# Patient Record
Sex: Female | Born: 1970
Health system: Southern US, Community
[De-identification: ages and names within clinical notes are randomized; demographics above are authoritative.]

## PROBLEM LIST (undated history)

## (undated) DIAGNOSIS — F419 Anxiety disorder, unspecified: Secondary | ICD-10-CM

## (undated) DIAGNOSIS — E538 Deficiency of other specified B group vitamins: Secondary | ICD-10-CM

## (undated) DIAGNOSIS — E559 Vitamin D deficiency, unspecified: Secondary | ICD-10-CM

## (undated) DIAGNOSIS — F32A Depression, unspecified: Secondary | ICD-10-CM

## (undated) DIAGNOSIS — R197 Diarrhea, unspecified: Secondary | ICD-10-CM

## (undated) HISTORY — DX: Depression, unspecified: F32.A

## (undated) HISTORY — DX: Vitamin D deficiency, unspecified: E55.9

## (undated) HISTORY — DX: Diarrhea, unspecified: R19.7

## (undated) HISTORY — DX: Deficiency of other specified B group vitamins: E53.8

## (undated) HISTORY — PX: WISDOM TOOTH EXTRACTION: SHX21

## (undated) HISTORY — DX: Anxiety disorder, unspecified: F41.9

---

## 2013-10-12 ENCOUNTER — Ambulatory Visit (INDEPENDENT_AMBULATORY_CARE_PROVIDER_SITE_OTHER): Payer: BC Managed Care – PPO | Admitting: Nurse Practitioner

## 2013-10-12 ENCOUNTER — Encounter: Payer: Self-pay | Admitting: Nurse Practitioner

## 2013-10-12 VITALS — BP 124/72 | HR 76 | Resp 16 | Ht 60.5 in | Wt 112.0 lb

## 2013-10-12 DIAGNOSIS — Z0000 Encounter for general adult medical examination without abnormal findings: Secondary | ICD-10-CM

## 2013-10-12 DIAGNOSIS — Z01419 Encounter for gynecological examination (general) (routine) without abnormal findings: Secondary | ICD-10-CM

## 2013-10-12 LAB — POCT URINALYSIS DIPSTICK
Bilirubin, UA: NEGATIVE
Blood, UA: NEGATIVE
GLUCOSE UA: NEGATIVE
Ketones, UA: NEGATIVE
Leukocytes, UA: NEGATIVE
NITRITE UA: NEGATIVE
PROTEIN UA: NEGATIVE
Urobilinogen, UA: NEGATIVE
pH, UA: 6

## 2013-10-12 LAB — HEMOGLOBIN, FINGERSTICK: HEMOGLOBIN, FINGERSTICK: 14.4 g/dL (ref 12.0–16.0)

## 2013-10-12 MED ORDER — NORETHIN-ETH ESTRAD-FE BIPHAS 1 MG-10 MCG / 10 MCG PO TABS: 1.0000 | ORAL_TABLET | Freq: Every day | ORAL | Status: DC

## 2013-10-12 NOTE — Patient Instructions (Signed)

## 2013-10-12 NOTE — Progress Notes (Signed)
Patient ID: Katie Petersen, female   DOB: [birth year], 43 y.o.   MRN: [id number] 43 y.o.  Divorced Caucasian Fe here for annual exam. Just moved from [place] to work here.  Moved here alone and has no family nearby.  Not dating or SA since her Divorce 10 years ago.  Normal menses was very heavy and severe dysmenorrhea every 11 days.  Then started OCP Lo Loestrin in 2012, now with amenorrhea.  Initially had some spotting.  Now no spotting for 8 months.  Patient's last menstrual period was 08/31/2010.          Sexually active: No.  The current method of family planning is OCP (estrogen/progesterone).    Exercising: Yes.    Gym/ health club routine includes yoga, swim and cardioboxing class.  Yoga once a week, swimming twice weekly and cardio boxing once weekly. Smoker:  no  Health Maintenance: Pap:  10/2012, negative with neg HR HPV per patient MMG:  12/2012, normal, but showed dense breast per patient TDaP:  ?, past due Labs: HB:  14.4  Urine:  negative   reports that she has never smoked. She has never used smokeless tobacco. She reports that she drinks alcohol. She reports that she does not use illicit drugs.  History reviewed. No pertinent past medical history.  Past Surgical History  Procedure Laterality Date  . Wisdom tooth extraction      Current Outpatient Prescriptions  Medication Sig Dispense Refill  . Norethindrone-Ethinyl Estradiol-Fe Biphas (LO LOESTRIN FE) 1 MG-10 MCG / 10 MCG tablet Take 1 tablet by mouth daily.  3 Package  3   No current facility-administered medications for this visit.    Family History  Problem Relation Age of Onset  . Hyperlipidemia Mother   . Hypertension Mother   . Thyroid disease Mother   . Breast cancer Paternal Aunt 30  . Breast cancer Maternal Grandmother 80  . Cancer Maternal Grandfather     lung, smoker and coal miner    ROS:  Pertinent items are noted in HPI.  Otherwise, a comprehensive ROS was negative.  Exam:   BP 124/72  Pulse  76  Resp 16  Ht 5' 0.5" (1.537 m)  Wt 112 lb (50.803 kg)  BMI 21.51 kg/m2  LMP 08/31/2010 Height: 5' 0.5" (153.7 cm)  Ht Readings from Last 3 Encounters:  10/12/13 5' 0.5" (1.537 m)    General appearance: alert, cooperative and appears stated age Head: Normocephalic, without obvious abnormality, atraumatic Neck: no adenopathy, supple, symmetrical, trachea midline and thyroid normal to inspection and palpation Lungs: clear to auscultation bilaterally Breasts: normal appearance, no masses or tenderness Heart: regular rate and rhythm Abdomen: soft, non-tender; no masses,  no organomegaly Extremities: extremities normal, atraumatic, no cyanosis or edema Skin: Skin color, texture, turgor normal. No rashes or lesions Lymph nodes: Cervical, supraclavicular, and axillary nodes normal. No abnormal inguinal nodes palpated Neurologic: Grossly normal   Pelvic: External genitalia:  no lesions              Urethra:  normal appearing urethra with no masses, tenderness or lesions              Bartholin's and Skene's: normal                 Vagina: normal appearing vagina with normal color and discharge, no lesions              Cervix: anteverted  Pap taken: No. Bimanual Exam:  Uterus:  normal size, contour, position, consistency, mobility, non-tender              Adnexa: no mass, fullness, tenderness               Rectovaginal: Confirms               Anus:  normal sphincter tone, no lesions  A:  Well Woman with normal exam  OCP for menorrhagia and irregular menses  Not SA  P:   Reviewed health and wellness pertinent to exam  Pap smear not taken today  Mammogram is due 10/15  Refill OCP Lo Loestrin for a year  Counseled on breast self exam, mammography screening, use and side effects of OCP's, adequate intake of calcium and vitamin D, diet and exercise, Kegel's exercises return annually or prn  An After Visit Summary was printed and given to the patient.  ROI of past GYN  records: if no pap or abnormal pap she is aware may need to return.  Otherwise  recheck in 1 year.

## 2013-10-14 NOTE — Progress Notes (Signed)
Note reviewed, agree with plan.  Marchel Foote, MD  

## 2014-08-25 ENCOUNTER — Other Ambulatory Visit: Payer: Self-pay | Admitting: Nurse Practitioner

## 2014-08-25 DIAGNOSIS — Z62810 Personal history of physical and sexual abuse in childhood: Secondary | ICD-10-CM | POA: Insufficient documentation

## 2014-08-25 NOTE — Telephone Encounter (Signed)
Medication refill request: Lo Loestrin  Last AEX:  10/12/13 PG Next AEX: 10/25/14 PG Last MMG (if hormonal medication request): none Refill authorized: 10/12/13 #3packs w/3 R. Today #3pack w/0R?

## 2014-10-25 ENCOUNTER — Encounter: Payer: Self-pay | Admitting: Nurse Practitioner

## 2014-10-25 ENCOUNTER — Ambulatory Visit (INDEPENDENT_AMBULATORY_CARE_PROVIDER_SITE_OTHER): Payer: BLUE CROSS/BLUE SHIELD | Admitting: Nurse Practitioner

## 2014-10-25 VITALS — BP 110/68 | HR 68 | Ht 60.75 in | Wt 123.0 lb

## 2014-10-25 DIAGNOSIS — Z0000 Encounter for general adult medical examination without abnormal findings: Secondary | ICD-10-CM

## 2014-10-25 DIAGNOSIS — Z01419 Encounter for gynecological examination (general) (routine) without abnormal findings: Secondary | ICD-10-CM

## 2014-10-25 LAB — POCT URINALYSIS DIPSTICK
Bilirubin, UA: NEGATIVE
Glucose, UA: NEGATIVE
Ketones, UA: NEGATIVE
Leukocytes, UA: NEGATIVE
NITRITE UA: NEGATIVE
PROTEIN UA: NEGATIVE
RBC UA: NEGATIVE
Urobilinogen, UA: NEGATIVE
pH, UA: 6

## 2014-10-25 MED ORDER — NORETHIN-ETH ESTRAD-FE BIPHAS 1 MG-10 MCG / 10 MCG PO TABS: 1.0000 | ORAL_TABLET | Freq: Every day | ORAL | Status: DC

## 2014-10-25 NOTE — Progress Notes (Signed)
Patient ID: Katie Petersen, female   DOB: January 01, 1971, 44 y.o.   MRN: [id number] 44 y.o. G0P0 Divorced  Caucasian Fe here for annual exam.  Menses on Lo Loestrin is absent.  initially got some spotting now none. Dating but not SA.  She is having an increase in eczema and skin allergies.  She saw someone at [facility] in [place] and had extensive testing for food allergies done.  She is working on cutting out gluten, dairy and eggs.   Patient's last menstrual period was 08/31/2010.          Sexually active: No. not SA for 10 years since divorce The current method of family planning is OCP (estrogen/progesterone).    Exercising: Yes.    walking and yoga 2-3 times per week Smoker:  no  Health Maintenance: Pap: 9/17/214, negative with neg HR HPV per patient MMG: 01/13/13, normal, but showed dense breast per patient TDaP: ?, past due Labs: HB: PCP, Dr. Mardelle Matte ([facility])Urine:  negative    reports that she has never smoked. She has never used smokeless tobacco. She reports that she drinks alcohol. She reports that she does not use illicit drugs.  History reviewed. No pertinent past medical history.  Past Surgical History  Procedure Laterality Date  . Wisdom tooth extraction      Current Outpatient Prescriptions  Medication Sig Dispense Refill  . Cholecalciferol (VITAMIN D3) 2000 UNITS TABS Take 1 tablet by mouth daily.    . clonazePAM (KLONOPIN) 1 MG tablet Take 1 tablet by mouth 2 (two) times daily as needed.    Marland Kitchen escitalopram (LEXAPRO) 10 MG tablet Take 1 tablet by mouth daily.    . magnesium oxide (MAG-OX) 400 MG tablet Take 800 mg by mouth daily.    . Norethindrone-Ethinyl Estradiol-Fe Biphas (LO LOESTRIN FE) 1 MG-10 MCG / 10 MCG tablet Take 1 tablet by mouth daily. 84 tablet 3  . vitamin B-12 (CYANOCOBALAMIN) 1000 MCG tablet Take 1,000 mcg by mouth daily.     No current facility-administered medications for this visit.    Family History  Problem Relation  Age of Onset  . Hyperlipidemia Mother   . Hypertension Mother   . Thyroid disease Mother   . Breast cancer Paternal Aunt 79  . Breast cancer Maternal Grandmother 80  . Cancer Maternal Grandfather     lung, smoker and coal miner    ROS:  Pertinent items are noted in HPI.  Otherwise, a comprehensive ROS was negative.  Exam:   BP 110/68 mmHg  Pulse 68  Ht 5' 0.75" (1.543 m)  Wt 123 lb (55.792 kg)  BMI 23.43 kg/m2  LMP 08/31/2010 Height: 5' 0.75" (154.3 cm) Ht Readings from Last 3 Encounters:  10/25/14 5' 0.75" (1.543 m)  10/12/13 5' 0.5" (1.537 m)    General appearance: alert, cooperative and appears stated age Head: Normocephalic, without obvious abnormality, atraumatic Neck: no adenopathy, supple, symmetrical, trachea midline and thyroid normal to inspection and palpation Lungs: clear to auscultation bilaterally Breasts: normal appearance, no masses or tenderness Heart: regular rate and rhythm Abdomen: soft, non-tender; no masses,  no organomegaly Extremities: extremities normal, atraumatic, no cyanosis or edema Skin: Skin color, texture, turgor normal. No rashes or lesions Lymph nodes: Cervical, supraclavicular, and axillary nodes normal. No abnormal inguinal nodes palpated Neurologic: Grossly normal   Pelvic: External genitalia:  no lesions              Urethra:  normal appearing urethra with no masses, tenderness or lesions  Bartholin's and Skene's: normal                 Vagina: normal appearing vagina with normal color and discharge, no lesions              Cervix: anteverted              Pap taken: Yes.   Bimanual Exam:  Uterus:  normal size, contour, position, consistency, mobility, non-tender              Adnexa: no mass, fullness, tenderness               Rectovaginal: Confirms               Anus:  normal sphincter tone, no lesions  Chaperone present:  yes  A:  Well Woman with normal exam  OCP for menorrhagia and irregular menses  Amenorrhea on  OCP Not SA  P:   Reviewed health and wellness pertinent to exam  Pap smear as above  Mammogram is due this fall and will schedule  Refill on Lo Loestrin for a year  Name and phone number given for [facility] in [place] as this may be closer for her  Counseled on breast self exam, mammography screening, adequate intake of calcium and vitamin D, diet and exercise return annually or prn  An After Visit Summary was printed and given to the patient.

## 2014-10-25 NOTE — Patient Instructions (Signed)

## 2014-10-27 LAB — IPS PAP TEST WITH HPV

## 2014-10-29 NOTE — Progress Notes (Signed)
Encounter reviewed by Dr. Brook Amundson C. Silva.  

## 2014-12-28 DIAGNOSIS — F410 Panic disorder [episodic paroxysmal anxiety] without agoraphobia: Secondary | ICD-10-CM | POA: Insufficient documentation

## 2014-12-28 DIAGNOSIS — Z3041 Encounter for surveillance of contraceptive pills: Secondary | ICD-10-CM | POA: Insufficient documentation

## 2014-12-28 HISTORY — DX: Encounter for surveillance of contraceptive pills: Z30.41

## 2015-02-21 ENCOUNTER — Telehealth: Payer: Self-pay | Admitting: Nurse Practitioner

## 2015-02-21 NOTE — Telephone Encounter (Signed)
Ok for change of pharmacy and med's can be refilled to her pharmacy of choice.  May close the encounter.

## 2015-02-21 NOTE — Telephone Encounter (Signed)
Patient called to notify us that she will be switching to a mail order pharmacy called [facility] which is apart of [facility] due to her having new insurance. She does not need any refills right now but will need to get her refills from them going forward.  Routed to Ms. Patty FYI.

## 2015-02-22 ENCOUNTER — Telehealth: Payer: Self-pay | Admitting: Nurse Practitioner

## 2015-02-22 MED ORDER — NORETHIN-ETH ESTRAD-FE BIPHAS 1 MG-10 MCG / 10 MCG PO TABS: 1.0000 | ORAL_TABLET | Freq: Every day | ORAL | Status: DC

## 2015-02-22 NOTE — Telephone Encounter (Signed)
OK to change to mail order

## 2015-02-22 NOTE — Telephone Encounter (Signed)
Patient is requesting a new 4990 DAY SUPPLY prescription for LO LO ESTRIN to be sent to [facility] mail order pharmacy. [phone] AND FAX [phone]

## 2015-02-22 NOTE — Telephone Encounter (Signed)
Order sent to mail order

## 2015-02-22 NOTE — Telephone Encounter (Signed)
Medication refill request: Patient is requesting Pharmacy change to [facility] for her Lo LoEstrin Rx. Last AEX:  10/25/2014 PG Next AEX: 11/07/2015 PG Last MMG (if hormonal medication request): 01/13/2013 Normal / Negative Refill authorized: Lo Loestrin FE #84 tabs 3 Refills 10/25/2014 Today: Lo Loestrin FE #84 tabs 3 Refills ?

## 2015-02-27 NOTE — Telephone Encounter (Signed)
Left detailed message for patient okay per designated party release form.  Advised to call back with any questions.

## 2015-11-07 ENCOUNTER — Ambulatory Visit: Payer: BLUE CROSS/BLUE SHIELD | Admitting: Nurse Practitioner

## 2015-11-15 ENCOUNTER — Encounter: Payer: Self-pay | Admitting: Nurse Practitioner

## 2015-11-15 ENCOUNTER — Ambulatory Visit (INDEPENDENT_AMBULATORY_CARE_PROVIDER_SITE_OTHER): Payer: PRIVATE HEALTH INSURANCE | Admitting: Nurse Practitioner

## 2015-11-15 VITALS — BP 120/74 | HR 64 | Ht 60.25 in | Wt 132.0 lb

## 2015-11-15 DIAGNOSIS — R8290 Unspecified abnormal findings in urine: Secondary | ICD-10-CM | POA: Diagnosis not present

## 2015-11-15 DIAGNOSIS — Z0000 Encounter for general adult medical examination without abnormal findings: Secondary | ICD-10-CM | POA: Diagnosis not present

## 2015-11-15 DIAGNOSIS — Z01419 Encounter for gynecological examination (general) (routine) without abnormal findings: Secondary | ICD-10-CM | POA: Diagnosis not present

## 2015-11-15 LAB — POCT URINALYSIS DIPSTICK
BILIRUBIN UA: NEGATIVE
Blood, UA: NEGATIVE
Glucose, UA: NEGATIVE
Ketones, UA: NEGATIVE
Nitrite, UA: NEGATIVE
PROTEIN UA: NEGATIVE
Urobilinogen, UA: NEGATIVE
pH, UA: 6

## 2015-11-15 MED ORDER — NORETHIN-ETH ESTRAD-FE BIPHAS 1 MG-10 MCG / 10 MCG PO TABS: 1.0000 | ORAL_TABLET | Freq: Every day | ORAL | 4 refills | Status: DC

## 2015-11-15 NOTE — Progress Notes (Signed)
Patient ID: Katie Petersen, female   DOB: March 28, 1971, 45 y.o.   MRN: [id number]  45 y.o. G0P0000 Divorced  Caucasian Fe here for annual exam.  Still amenorrhea on OCP.  She has dated but not SA.  She and friends are going to [place] in [place].   Patient's last menstrual period was 08/31/2010 (within weeks).          Sexually active: No.  The current method of family planning is OCP (estrogen/progesterone) and abstinence.    Exercising: Yes.    Home exercise routine includes yoga and walking . Smoker:  no  Health Maintenance: Pap:10/25/14, negative with neg HR HPV MMG: 01/13/13, normal, but showed dense breast per patient - done [place] TDaP:10/31/14 HIV: will wait for PCP physical as she is a hard draw Labs: PCP, in Care Everywhere   reports that she has never smoked. She has never used smokeless tobacco. She reports that she drinks alcohol. She reports that she does not use drugs.  History reviewed. No pertinent past medical history.  Past Surgical History:  Procedure Laterality Date  . WISDOM TOOTH EXTRACTION      Current Outpatient Prescriptions  Medication Sig Dispense Refill  . Cholecalciferol (VITAMIN D3) 2000 UNITS TABS Take 1 tablet by mouth daily.    . clonazePAM (KLONOPIN) 1 MG tablet Take 1 tablet by mouth 2 (two) times daily as needed.    Marland Kitchen. escitalopram (LEXAPRO) 10 MG tablet Take 1 tablet by mouth daily.    . magnesium oxide (MAG-OX) 400 MG tablet Take 800 mg by mouth daily.    . Norethindrone-Ethinyl Estradiol-Fe Biphas (LO LOESTRIN FE) 1 MG-10 MCG / 10 MCG tablet Take 1 tablet by mouth daily. 84 tablet 3  . vitamin B-12 (CYANOCOBALAMIN) 1000 MCG tablet Take 1,000 mcg by mouth daily.     No current facility-administered medications for this visit.     Family History  Problem Relation Age of Onset  . Hyperlipidemia Mother   . Hypertension Mother   . Thyroid disease Mother   . Breast cancer Paternal Aunt 7060  . Breast cancer Maternal Grandmother 80  . Cancer  Maternal Grandfather     lung, smoker and coal miner    ROS:  Pertinent items are noted in HPI.  Otherwise, a comprehensive ROS was negative.  Exam:   BP 120/74 (BP Location: Right Arm, Patient Position: Sitting, Cuff Size: Normal)   Pulse 64   Ht 5' 0.25" (1.53 m)   Wt 132 lb (59.9 kg)   LMP 08/31/2010 (Within Weeks)   BMI 25.57 kg/m  Height: 5' 0.25" (153 cm) Ht Readings from Last 3 Encounters:  11/15/15 5' 0.25" (1.53 m)  10/25/14 5' 0.75" (1.543 m)  10/12/13 5' 0.5" (1.537 m)    General appearance: alert, cooperative and appears stated age Head: Normocephalic, without obvious abnormality, atraumatic Neck: no adenopathy, supple, symmetrical, trachea midline and thyroid normal to inspection and palpation Lungs: clear to auscultation bilaterally Breasts: normal appearance, no masses or tenderness Heart: regular rate and rhythm Abdomen: soft, non-tender; no masses,  no organomegaly Extremities: extremities normal, atraumatic, no cyanosis or edema Skin: Skin color, texture, turgor normal. No rashes or lesions Lymph nodes: Cervical, supraclavicular, and axillary nodes normal. No abnormal inguinal nodes palpated Neurologic: Grossly normal   Pelvic: External genitalia:  no lesions              Urethra:  normal appearing urethra with no masses, tenderness or lesions  Bartholin's and Skene's: normal                 Vagina: normal appearing vagina with normal color and discharge, no lesions              Cervix: anteverted              Pap taken: No. Bimanual Exam:  Uterus:  normal size, contour, position, consistency, mobility, non-tender              Adnexa: no mass, fullness, tenderness               Rectovaginal: Confirms               Anus:  normal sphincter tone, no lesions  Chaperone present: yes  A:  Well Woman with normal exam  OCP for menorrhagia and irregular menses             Amenorrhea on OCP Not SA  Abnormal urine - will check for UTI/  vaginitis    P:   Reviewed health and wellness pertinent to exam  Pap smear as above  Mammogram is due and she will schedule - info is given  Follow with [facility]  Counseled on breast self exam, mammography screening, STD prevention, use and side effects of OCP's, adequate intake of calcium and vitamin D, diet and exercise return annually or prn  An After Visit Summary was printed and given to the patient.

## 2015-11-15 NOTE — Patient Instructions (Addendum)

## 2015-11-16 LAB — URINE CULTURE

## 2015-11-16 LAB — WET PREP BY MOLECULAR PROBE
Candida species: NEGATIVE
GARDNERELLA VAGINALIS: NEGATIVE
TRICHOMONAS VAG: NEGATIVE

## 2015-11-16 NOTE — Progress Notes (Signed)
Reviewed personally.  M. Suzanne Sayra Frisby, MD.  

## 2016-04-10 DIAGNOSIS — G4726 Circadian rhythm sleep disorder, shift work type: Secondary | ICD-10-CM | POA: Insufficient documentation

## 2016-04-10 DIAGNOSIS — L219 Seborrheic dermatitis, unspecified: Secondary | ICD-10-CM | POA: Insufficient documentation

## 2016-05-13 ENCOUNTER — Other Ambulatory Visit: Payer: Self-pay | Admitting: Nurse Practitioner

## 2016-05-13 DIAGNOSIS — Z1231 Encounter for screening mammogram for malignant neoplasm of breast: Secondary | ICD-10-CM

## 2016-05-24 ENCOUNTER — Ambulatory Visit
Admission: RE | Admit: 2016-05-24 | Discharge: 2016-05-24 | Disposition: A | Discharge status: Home / Self Care | Payer: Commercial Managed Care - PPO | Source: Ambulatory Visit | Attending: Nurse Practitioner | Admitting: Nurse Practitioner

## 2016-05-24 DIAGNOSIS — Z1231 Encounter for screening mammogram for malignant neoplasm of breast: Secondary | ICD-10-CM

## 2016-08-09 ENCOUNTER — Telehealth: Payer: Self-pay | Admitting: Nurse Practitioner

## 2016-08-09 MED ORDER — NORETHIN-ETH ESTRAD-FE BIPHAS 1 MG-10 MCG / 10 MCG PO TABS: 1.0000 | ORAL_TABLET | Freq: Every day | ORAL | 0 refills | Status: DC

## 2016-08-09 NOTE — Telephone Encounter (Signed)
Medication refill request: Norethindrone-Ethinyl Estradiol Last AEX:  11/15/15 PG Next AEX: 11/27/16 PG Last MMG (if hormonal medication request): 06/05/16 [email or web address], [facility] Refill authorized: 11/15/15 #84 4R. Please advise. Thank you.

## 2016-08-09 NOTE — Telephone Encounter (Signed)
Patient is using a mail order pharmacy and has not received her medication yet. She is asking for Lo Lo estrin  to be sent to [facility] on [address].

## 2016-08-09 NOTE — Telephone Encounter (Signed)
OK to refill until AEX. - she should have enough RX from previous one of 8/17 till 8/18.

## 2016-08-12 NOTE — Telephone Encounter (Signed)
Prescription was sent in to last until August appointment per PG.

## 2016-08-14 ENCOUNTER — Telehealth: Payer: Self-pay | Admitting: Nurse Practitioner

## 2016-08-14 NOTE — Telephone Encounter (Signed)
[facility]

## 2016-08-14 NOTE — Telephone Encounter (Signed)
Spoke with patient. Patient states that her mail order pharmacy states her birth control has been delivered, but it is not at her home. Requesting [facility] so she can pay [facility]OP for the medication. Advised 3 month supply of Lo Loestrin Fe was sent to her pharmacy on 08/09/2016. Patient verbalizes understanding and will contact her pharmacy to fill. Will return call with any questions.  Routing to provider for final review. Patient agreeable to disposition. Will close encounter.

## 2016-08-14 NOTE — Telephone Encounter (Signed)
Patient following up on phone call from Friday about her birth control.

## 2016-10-14 ENCOUNTER — Telehealth: Payer: Self-pay | Admitting: Obstetrics and Gynecology

## 2016-10-14 NOTE — Telephone Encounter (Signed)
Left message regarding upcoming appointment has been canceled and needs to be rescheduled. °

## 2016-11-27 ENCOUNTER — Telehealth: Payer: Self-pay | Admitting: Obstetrics and Gynecology

## 2016-11-27 ENCOUNTER — Ambulatory Visit: Payer: PRIVATE HEALTH INSURANCE | Admitting: Obstetrics and Gynecology

## 2016-11-27 ENCOUNTER — Ambulatory Visit: Payer: PRIVATE HEALTH INSURANCE | Admitting: Nurse Practitioner

## 2016-11-27 NOTE — Telephone Encounter (Signed)
Patient canceled her aex today due to migraine. Patient rescheduled to tomorrow at 1:00pm.

## 2016-11-28 ENCOUNTER — Ambulatory Visit (INDEPENDENT_AMBULATORY_CARE_PROVIDER_SITE_OTHER): Payer: Commercial Managed Care - PPO | Admitting: Obstetrics and Gynecology

## 2016-11-28 ENCOUNTER — Encounter: Payer: Self-pay | Admitting: Obstetrics and Gynecology

## 2016-11-28 VITALS — BP 120/60 | HR 92 | Resp 14 | Ht 60.5 in | Wt 140.0 lb

## 2016-11-28 DIAGNOSIS — Z3041 Encounter for surveillance of contraceptive pills: Secondary | ICD-10-CM | POA: Diagnosis not present

## 2016-11-28 DIAGNOSIS — Z01419 Encounter for gynecological examination (general) (routine) without abnormal findings: Secondary | ICD-10-CM | POA: Diagnosis not present

## 2016-11-28 MED ORDER — NORETHIN-ETH ESTRAD-FE BIPHAS 1 MG-10 MCG / 10 MCG PO TABS: 1.0000 | ORAL_TABLET | Freq: Every day | ORAL | 3 refills | Status: DC

## 2016-11-28 NOTE — Progress Notes (Signed)
46 y.o. G0P0000 DivorcedCaucasianF here for annual exam.  No cycles on LoLoestrin. Went on OCP's for cycle control (heavy, cramps, spotting).  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 She c/o ankle swelling after a long shift. She c/o weight gain (up 8 lbs in the last year).     No LMP recorded. Patient is not currently having periods (Reason: Oral contraceptives).          Sexually active: Yes.    The current method of family planning is OCP (estrogen/progesterone).    Exercising: No.  The patient does not participate in regular exercise at present. Smoker:  no  Health Maintenance: Pap:  10-25-14 WNL NEG HR HPV  History of abnormal Pap:  no MMG:  05-24-16 WNL  Colonoscopy:  Never BMD:   Never TDaP:  10-31-14 Gardasil: N/A   reports that she has never smoked. She has never used smokeless tobacco. She reports that she drinks alcohol. She reports that she does not use drugs. 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 Looking for a new job. 1-2 glasses of wine every 2 weeks.   Past Medical History:  Diagnosis Date  . Vitamin B12 deficiency   . Vitamin D deficiency     Past Surgical History:  Procedure Laterality Date  . WISDOM TOOTH EXTRACTION      Current Outpatient Prescriptions  Medication Sig Dispense Refill  . Cholecalciferol (VITAMIN D3) 2000 UNITS TABS Take 1 tablet by mouth daily.    . clonazePAM (KLONOPIN) 1 MG tablet Take 1 tablet by mouth 2 (two) times daily as needed.    Marland Kitchen. escitalopram (LEXAPRO) 10 MG tablet Take 1 tablet by mouth daily.    . magnesium oxide (MAG-OX) 400 MG tablet Take 800 mg by mouth daily.    . Norethindrone-Ethinyl Estradiol-Fe Biphas (LO LOESTRIN FE) 1 MG-10 MCG / 10 MCG tablet Take 1 tablet by mouth daily. 84 tablet 0  . vitamin B-12 (CYANOCOBALAMIN) 1000 MCG tablet Take 1,000 mcg by mouth daily.     No current facility-administered medications for this visit.     Family History  Problem  Relation Age of Onset  . Hyperlipidemia Mother   . Hypertension Mother   . Thyroid disease Mother   . Breast cancer Paternal Aunt 2760  . Breast cancer Maternal Grandmother 80  . Cancer Maternal Grandfather        lung, smoker and coal miner    Review of Systems  Constitutional: Negative.   HENT: Negative.   Eyes: Negative.   Respiratory: Negative.   Cardiovascular: Negative.   Gastrointestinal: Negative.   Endocrine: Negative.   Genitourinary: Negative.   Musculoskeletal: Negative.   Skin: Negative.   Allergic/Immunologic: Negative.   Neurological: Negative.   Psychiatric/Behavioral: Negative.     Exam:   BP 120/60 (BP Location: Right Arm, Patient Position: Sitting, Cuff Size: Normal)   Pulse 92   Resp 14   Ht 5' 0.5" (1.537 m)   Wt 140 lb (63.5 kg)   BMI 26.89 kg/m   Weight change: @WEIGHTCHANGE @ Height:   Height: 5' 0.5" (153.7 cm)  Ht Readings from Last 3 Encounters:  11/28/16 5' 0.5" (1.537 m)  11/15/15 5' 0.25" (1.53 m)  10/25/14 5' 0.75" (1.543 m)    General appearance: alert, cooperative and appears stated age Head: Normocephalic, without obvious abnormality, atraumatic Neck: no adenopathy, supple, symmetrical, trachea midline and thyroid normal to inspection and palpation Lungs: clear to auscultation bilaterally  Cardiovascular: regular rate and rhythm Breasts: normal appearance, no masses or tenderness Abdomen: soft, non-tender; bowel sounds normal; no masses,  no organomegaly Extremities: extremities normal, atraumatic, no cyanosis or edema Skin: Skin color, texture, turgor normal. No rashes or lesions Lymph nodes: Cervical, supraclavicular, and axillary nodes normal. No abnormal inguinal nodes palpated Neurologic: Grossly normal   Pelvic: External genitalia:  no lesions              Urethra:  normal appearing urethra with no masses, tenderness or lesions              Bartholins and Skenes: normal                 Vagina: normal appearing vagina with  normal color and discharge, no lesions              Cervix: no lesions               Bimanual Exam:  Uterus:  normal size, contour, position, consistency, mobility, non-tender, retroverted              Adnexa: no mass, fullness, tenderness               Rectovaginal: Confirms               Anus:  normal sphincter tone, no lesions  Chaperone was present for exam.  A:  Well Woman with normal exam  On OCP's for cycle control  P:   Labs are due in January  Will f/u with new primary MD  Continue with OCP's  Mammogram UTD  Discussed breast self exam  Discussed calcium and vit D intake

## 2016-11-28 NOTE — Patient Instructions (Signed)
EXERCISE AND DIET:  We recommended that you start or continue a regular exercise program for good health. Regular exercise means any activity that makes your heart beat faster and makes you sweat.  We recommend exercising at least 30 minutes per day at least 3 days a week, preferably 4 or 5.  We also recommend a diet low in fat and sugar.  Inactivity, poor dietary choices and obesity can cause diabetes, heart attack, stroke, and kidney damage, among others.    ALCOHOL AND SMOKING:  Women should limit their alcohol intake to no more than 7 drinks/beers/glasses of wine (combined, not each!) per week. Moderation of alcohol intake to this level decreases your risk of breast cancer and liver damage. And of course, no recreational drugs are part of a healthy lifestyle.  And absolutely no smoking or even second hand smoke. Most people know smoking can cause heart and lung diseases, but did you know it also contributes to weakening of your bones? Aging of your skin?  Yellowing of your teeth and nails?  CALCIUM AND VITAMIN D:  Adequate intake of calcium and Vitamin D are recommended.  The recommendations for exact amounts of these supplements seem to change often, but generally speaking 600 mg of calcium (either carbonate or citrate) and 800 units of Vitamin D per day seems prudent. Certain women may benefit from higher intake of Vitamin D.  If you are among these women, your doctor will have told you during your visit.    PAP SMEARS:  Pap smears, to check for cervical cancer or precancers,  have traditionally been done yearly, although recent scientific advances have shown that most women can have pap smears less often.  However, every woman still should have a physical exam from her gynecologist every year. It will include a breast check, inspection of the vulva and vagina to check for abnormal growths or skin changes, a visual exam of the cervix, and then an exam to evaluate the size and shape of the uterus and  ovaries.  And after 44 years of age, a rectal exam is indicated to check for rectal cancers. We will also provide age appropriate advice regarding health maintenance, like when you should have certain vaccines, screening for sexually transmitted diseases, bone density testing, colonoscopy, mammograms, etc.   MAMMOGRAMS:  All women over 40 years old should have a yearly mammogram. Many facilities now offer a "3D" mammogram, which may cost around $50 extra out of pocket. If possible,  we recommend you accept the option to have the 3D mammogram performed.  It both reduces the number of women who will be called back for extra views which then turn out to be normal, and it is better than the routine mammogram at detecting truly abnormal areas.    COLONOSCOPY:  Colonoscopy to screen for colon cancer is recommended for all women at age 50.  We know, you hate the idea of the prep.  We agree, BUT, having colon cancer and not knowing it is worse!!  Colon cancer so often starts as a polyp that can be seen and removed at colonscopy, which can quite literally save your life!  And if your first colonoscopy is normal and you have no family history of colon cancer, most women don't have to have it again for 10 years.  Once every ten years, you can do something that may end up saving your life, right?  We will be happy to help you get it scheduled when you are ready.    Be sure to check your insurance coverage so you understand how much it will cost.  It may be covered as a preventative service at no cost, but you should check your particular policy.      Breast Self-Awareness Breast self-awareness means being familiar with how your breasts look and feel. It involves checking your breasts regularly and reporting any changes to your health care provider. Practicing breast self-awareness is important. A change in your breasts can be a sign of a serious medical problem. Being familiar with how your breasts look and feel allows  you to find any problems early, when treatment is more likely to be successful. All women should practice breast self-awareness, including women who have had breast implants. How to do a breast self-exam One way to learn what is normal for your breasts and whether your breasts are changing is to do a breast self-exam. To do a breast self-exam: Look for Changes  1. Remove all the clothing above your waist. 2. Stand in front of a mirror in a room with good lighting. 3. Put your hands on your hips. 4. Push your hands firmly downward. 5. Compare your breasts in the mirror. Look for differences between them (asymmetry), such as: ? Differences in shape. ? Differences in size. ? Puckers, dips, and bumps in one breast and not the other. 6. Look at each breast for changes in your skin, such as: ? Redness. ? Scaly areas. 7. Look for changes in your nipples, such as: ? Discharge. ? Bleeding. ? Dimpling. ? Redness. ? A change in position. Feel for Changes  Carefully feel your breasts for lumps and changes. It is best to do this while lying on your back on the floor and again while sitting or standing in the shower or tub with soapy water on your skin. Feel each breast in the following way:  Place the arm on the side of the breast you are examining above your head.  Feel your breast with the other hand.  Start in the nipple area and make  inch (2 cm) overlapping circles to feel your breast. Use the pads of your three middle fingers to do this. Apply light pressure, then medium pressure, then firm pressure. The light pressure will allow you to feel the tissue closest to the skin. The medium pressure will allow you to feel the tissue that is a little deeper. The firm pressure will allow you to feel the tissue close to the ribs.  Continue the overlapping circles, moving downward over the breast until you feel your ribs below your breast.  Move one finger-width toward the center of the body.  Continue to use the  inch (2 cm) overlapping circles to feel your breast as you move slowly up toward your collarbone.  Continue the up and down exam using all three pressures until you reach your armpit.  Write Down What You Find  Write down what is normal for each breast and any changes that you find. Keep a written record with breast changes or normal findings for each breast. By writing this information down, you do not need to depend only on memory for size, tenderness, or location. Write down where you are in your menstrual cycle, if you are still menstruating. If you are having trouble noticing differences in your breasts, do not get discouraged. With time you will become more familiar with the variations in your breasts and more comfortable with the exam. How often should I examine my breasts? Examine   your breasts every month. If you are breastfeeding, the best time to examine your breasts is after a feeding or after using a breast pump. If you menstruate, the best time to examine your breasts is 5-7 days after your period is over. During your period, your breasts are lumpier, and it may be more difficult to notice changes. When should I see my health care provider? See your health care provider if you notice:  A change in shape or size of your breasts or nipples.  A change in the skin of your breast or nipples, such as a reddened or scaly area.  Unusual discharge from your nipples.  A lump or thick area that was not there before.  Pain in your breasts.  Anything that concerns you.  This information is not intended to replace advice given to you by your health care provider. Make sure you discuss any questions you have with your health care provider. Document Released: 03/18/2005 Document Revised: 08/24/2015 Document Reviewed: 02/05/2015 Elsevier Interactive Patient Education  2018 [employer]  

## 2017-04-29 ENCOUNTER — Other Ambulatory Visit: Payer: Self-pay | Admitting: Obstetrics and Gynecology

## 2017-04-29 DIAGNOSIS — Z1231 Encounter for screening mammogram for malignant neoplasm of breast: Secondary | ICD-10-CM

## 2017-05-22 DIAGNOSIS — Z8669 Personal history of other diseases of the nervous system and sense organs: Secondary | ICD-10-CM | POA: Insufficient documentation

## 2017-05-22 DIAGNOSIS — F331 Major depressive disorder, recurrent, moderate: Secondary | ICD-10-CM | POA: Insufficient documentation

## 2017-05-22 DIAGNOSIS — G43009 Migraine without aura, not intractable, without status migrainosus: Secondary | ICD-10-CM | POA: Insufficient documentation

## 2017-05-22 DIAGNOSIS — M797 Fibromyalgia: Secondary | ICD-10-CM | POA: Insufficient documentation

## 2017-05-22 DIAGNOSIS — G9332 Myalgic encephalomyelitis/chronic fatigue syndrome: Secondary | ICD-10-CM | POA: Insufficient documentation

## 2017-05-22 DIAGNOSIS — Z8659 Personal history of other mental and behavioral disorders: Secondary | ICD-10-CM | POA: Insufficient documentation

## 2017-05-22 DIAGNOSIS — K9041 Non-celiac gluten sensitivity: Secondary | ICD-10-CM | POA: Insufficient documentation

## 2017-05-28 ENCOUNTER — Ambulatory Visit
Admission: RE | Admit: 2017-05-28 | Discharge: 2017-05-28 | Disposition: A | Discharge status: Home / Self Care | Payer: Commercial Managed Care - PPO | Source: Ambulatory Visit | Attending: Obstetrics and Gynecology | Admitting: Obstetrics and Gynecology

## 2017-05-28 DIAGNOSIS — Z1231 Encounter for screening mammogram for malignant neoplasm of breast: Secondary | ICD-10-CM

## 2017-10-28 ENCOUNTER — Other Ambulatory Visit: Payer: Self-pay | Admitting: Obstetrics and Gynecology

## 2017-10-29 NOTE — Telephone Encounter (Signed)
Medication refill request: Lo Loestrin Fe Last AEX:  11-28-16 Next AEX: 12-10-17 Last MMG (if hormonal medication request): 05-28-17 category c density birads 1:neg Refill authorized: please approve to last until aex

## 2017-12-10 ENCOUNTER — Ambulatory Visit: Payer: Commercial Managed Care - PPO | Admitting: Obstetrics and Gynecology

## 2017-12-17 ENCOUNTER — Other Ambulatory Visit (HOSPITAL_COMMUNITY)
Admission: RE | Admit: 2017-12-17 | Discharge: 2017-12-17 | Disposition: A | Discharge status: Home / Self Care | Payer: BLUE CROSS/BLUE SHIELD | Source: Ambulatory Visit | Attending: Obstetrics and Gynecology | Admitting: Obstetrics and Gynecology

## 2017-12-17 ENCOUNTER — Other Ambulatory Visit: Payer: Self-pay

## 2017-12-17 ENCOUNTER — Ambulatory Visit (INDEPENDENT_AMBULATORY_CARE_PROVIDER_SITE_OTHER): Payer: BLUE CROSS/BLUE SHIELD | Admitting: Obstetrics and Gynecology

## 2017-12-17 ENCOUNTER — Encounter: Payer: Self-pay | Admitting: Obstetrics and Gynecology

## 2017-12-17 VITALS — BP 122/60 | HR 84 | Resp 16 | Ht 60.5 in | Wt 130.0 lb

## 2017-12-17 DIAGNOSIS — Z01419 Encounter for gynecological examination (general) (routine) without abnormal findings: Secondary | ICD-10-CM

## 2017-12-17 DIAGNOSIS — Z124 Encounter for screening for malignant neoplasm of cervix: Secondary | ICD-10-CM

## 2017-12-17 DIAGNOSIS — Z1211 Encounter for screening for malignant neoplasm of colon: Secondary | ICD-10-CM

## 2017-12-17 MED ORDER — NORETHIN-ETH ESTRAD-FE BIPHAS 1 MG-10 MCG / 10 MCG PO TABS: 1.0000 | ORAL_TABLET | Freq: Every day | ORAL | 3 refills | Status: DC

## 2017-12-17 NOTE — Progress Notes (Signed)
47 y.o. G0P0000 Divorced White or Caucasian Not Hispanic or Latino female here for annual exam.  She is on OCP's for cycle control, no cycles on LoLoestrin.     No LMP recorded. (Menstrual status: Oral contraceptives).          Sexually active: No.  The current method of family planning is OCP (estrogen/progesterone).    Exercising: Yes.    walking Smoker:  no  Health Maintenance: Pap:  10-25-14 WNL NEG HR HPV  History of abnormal Pap:  no MMG:  05-28-17 WNL  BMD:   N/A Colonoscopy: N/A TDaP:  11-22-14 Gardasil: no    reports that she has never smoked. She has never used smokeless tobacco. She reports that she drinks about 1.0 - 2.0 standard drinks of alcohol per week. 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 Vitamin B12 deficiency   . Vitamin D deficiency     Past Surgical History:  Procedure Laterality Date  . WISDOM TOOTH EXTRACTION      Current Outpatient Medications  Medication Sig Dispense Refill  . Cholecalciferol (VITAMIN D3) 2000 UNITS TABS Take 1 tablet by mouth daily.    . clonazePAM (KLONOPIN) 1 MG tablet Take 1 tablet by mouth 2 (two) times daily as needed.    . LO LOESTRIN FE 1 MG-10 MCG / 10 MCG tablet TAKE 1 TABLET BY MOUTH  DAILY 84 tablet 0  . magnesium oxide (MAG-OX) 400 MG tablet Take 800 mg by mouth daily.    . vitamin B-12 (CYANOCOBALAMIN) 1000 MCG tablet Take 1,000 mcg by mouth daily.     No current facility-administered medications for this visit.     Family History  Problem Relation Age of Onset  . Hyperlipidemia Mother   . Hypertension Mother   . Thyroid disease Mother   . Breast cancer Paternal Aunt [id number]  . Breast cancer Maternal Grandmother 80  . Cancer Maternal Grandfather        lung, smoker and coal miner    Review of Systems  Constitutional: Negative.   HENT: Negative.   Eyes: Negative.   Respiratory: Negative.   Cardiovascular: Negative.   Gastrointestinal: Negative.    Endocrine: Negative.   Genitourinary: Negative.   Musculoskeletal: Negative.   Skin: Negative.   Allergic/Immunologic: Negative.   Neurological: Negative.   Psychiatric/Behavioral: Negative.     Exam:   BP 122/60 (BP Location: Right Arm, Patient Position: Sitting, Cuff Size: Normal)   Pulse 84   Resp 16   Ht 5' 0.5" (1.537 m)   Wt 130 lb (59 kg)   BMI 24.97 kg/m   Weight change: @WEIGHTCHANGE @ Height:   Height: 5' 0.5" (153.7 cm)  Ht Readings from Last 3 Encounters:  12/17/17 5' 0.5" (1.537 m)  11/28/16 5' 0.5" (1.537 m)  11/15/15 5' 0.25" (1.53 m)    General appearance: alert, cooperative and appears stated age Head: Normocephalic, without obvious abnormality, atraumatic Neck: no adenopathy, supple, symmetrical, trachea midline and thyroid normal to inspection and palpation Lungs: clear to auscultation bilaterally Cardiovascular: regular rate and rhythm Breasts: normal appearance, no masses or tenderness Abdomen: soft, non-tender; non distended,  no masses,  no organomegaly Extremities: extremities normal, atraumatic, no cyanosis or edema Skin: Skin color, texture, turgor normal. No rashes or lesions Lymph nodes: Cervical, supraclavicular, and axillary nodes normal. No abnormal inguinal nodes palpated Neurologic: Grossly normal   Pelvic: External genitalia:  no lesions  Urethra:  normal appearing urethra with no masses, tenderness or lesions              Bartholins and Skenes: normal                 Vagina: normal appearing vagina with normal color and discharge, no lesions              Cervix: no lesions               Bimanual Exam:  Uterus:  normal size, contour, position, consistency, mobility, non-tender              Adnexa: no mass, fullness, tenderness               Rectovaginal: Confirms               Anus:  normal sphincter tone, no lesions  Chaperone was present for exam.  A:  Well Woman with normal exam  P:   Pap with hpv  Discussed breast  self exam  Discussed calcium and vit D intake  Continue OCP's  Labs with other MD  IFOB  Mammogram in 2/20

## 2017-12-17 NOTE — Patient Instructions (Signed)
EXERCISE AND DIET:  We recommended that you start or continue a regular exercise program for good health. Regular exercise means any activity that makes your heart beat faster and makes you sweat.  We recommend exercising at least 30 minutes per day at least 3 days a week, preferably 4 or 5.  We also recommend a diet low in fat and sugar.  Inactivity, poor dietary choices and obesity can cause diabetes, heart attack, stroke, and kidney damage, among others.    ALCOHOL AND SMOKING:  Women should limit their alcohol intake to no more than 7 drinks/beers/glasses of wine (combined, not each!) per week. Moderation of alcohol intake to this level decreases your risk of breast cancer and liver damage. And of course, no recreational drugs are part of a healthy lifestyle.  And absolutely no smoking or even second hand smoke. Most people know smoking can cause heart and lung diseases, but did you know it also contributes to weakening of your bones? Aging of your skin?  Yellowing of your teeth and nails?  CALCIUM AND VITAMIN D:  Adequate intake of calcium and Vitamin D are recommended.  The recommendations for exact amounts of these supplements seem to change often, but generally speaking 600 mg of calcium (either carbonate or citrate) and 800 units of Vitamin D per day seems prudent. Certain women may benefit from higher intake of Vitamin D.  If you are among these women, your doctor will have told you during your visit.    PAP SMEARS:  Pap smears, to check for cervical cancer or precancers,  have traditionally been done yearly, although recent scientific advances have shown that most women can have pap smears less often.  However, every woman still should have a physical exam from her gynecologist every year. It will include a breast check, inspection of the vulva and vagina to check for abnormal growths or skin changes, a visual exam of the cervix, and then an exam to evaluate the size and shape of the uterus and  ovaries.  And after 45 years of age, a rectal exam is indicated to check for rectal cancers. We will also provide age appropriate advice regarding health maintenance, like when you should have certain vaccines, screening for sexually transmitted diseases, bone density testing, colonoscopy, mammograms, etc.   MAMMOGRAMS:  All women over 40 years old should have a yearly mammogram. Many facilities now offer a "3D" mammogram, which may cost around $50 extra out of pocket. If possible,  we recommend you accept the option to have the 3D mammogram performed.  It both reduces the number of women who will be called back for extra views which then turn out to be normal, and it is better than the routine mammogram at detecting truly abnormal areas.    COLONOSCOPY:  Colonoscopy to screen for colon cancer is recommended for all women at age 50.  We know, you hate the idea of the prep.  We agree, BUT, having colon cancer and not knowing it is worse!!  Colon cancer so often starts as a polyp that can be seen and removed at colonscopy, which can quite literally save your life!  And if your first colonoscopy is normal and you have no family history of colon cancer, most women don't have to have it again for 10 years.  Once every ten years, you can do something that may end up saving your life, right?  We will be happy to help you get it scheduled when you are ready.    Be sure to check your insurance coverage so you understand how much it will cost.  It may be covered as a preventative service at no cost, but you should check your particular policy.      Breast Self-Awareness Breast self-awareness means being familiar with how your breasts look and feel. It involves checking your breasts regularly and reporting any changes to your health care provider. Practicing breast self-awareness is important. A change in your breasts can be a sign of a serious medical problem. Being familiar with how your breasts look and feel allows  you to find any problems early, when treatment is more likely to be successful. All women should practice breast self-awareness, including women who have had breast implants. How to do a breast self-exam One way to learn what is normal for your breasts and whether your breasts are changing is to do a breast self-exam. To do a breast self-exam: Look for Changes  1. Remove all the clothing above your waist. 2. Stand in front of a mirror in a room with good lighting. 3. Put your hands on your hips. 4. Push your hands firmly downward. 5. Compare your breasts in the mirror. Look for differences between them (asymmetry), such as: ? Differences in shape. ? Differences in size. ? Puckers, dips, and bumps in one breast and not the other. 6. Look at each breast for changes in your skin, such as: ? Redness. ? Scaly areas. 7. Look for changes in your nipples, such as: ? Discharge. ? Bleeding. ? Dimpling. ? Redness. ? A change in position. Feel for Changes  Carefully feel your breasts for lumps and changes. It is best to do this while lying on your back on the floor and again while sitting or standing in the shower or tub with soapy water on your skin. Feel each breast in the following way:  Place the arm on the side of the breast you are examining above your head.  Feel your breast with the other hand.  Start in the nipple area and make  inch (2 cm) overlapping circles to feel your breast. Use the pads of your three middle fingers to do this. Apply light pressure, then medium pressure, then firm pressure. The light pressure will allow you to feel the tissue closest to the skin. The medium pressure will allow you to feel the tissue that is a little deeper. The firm pressure will allow you to feel the tissue close to the ribs.  Continue the overlapping circles, moving downward over the breast until you feel your ribs below your breast.  Move one finger-width toward the center of the body.  Continue to use the  inch (2 cm) overlapping circles to feel your breast as you move slowly up toward your collarbone.  Continue the up and down exam using all three pressures until you reach your armpit.  Write Down What You Find  Write down what is normal for each breast and any changes that you find. Keep a written record with breast changes or normal findings for each breast. By writing this information down, you do not need to depend only on memory for size, tenderness, or location. Write down where you are in your menstrual cycle, if you are still menstruating. If you are having trouble noticing differences in your breasts, do not get discouraged. With time you will become more familiar with the variations in your breasts and more comfortable with the exam. How often should I examine my breasts? Examine   your breasts every month. If you are breastfeeding, the best time to examine your breasts is after a feeding or after using a breast pump. If you menstruate, the best time to examine your breasts is 5-7 days after your period is over. During your period, your breasts are lumpier, and it may be more difficult to notice changes. When should I see my health care provider? See your health care provider if you notice:  A change in shape or size of your breasts or nipples.  A change in the skin of your breast or nipples, such as a reddened or scaly area.  Unusual discharge from your nipples.  A lump or thick area that was not there before.  Pain in your breasts.  Anything that concerns you.  This information is not intended to replace advice given to you by your health care provider. Make sure you discuss any questions you have with your health care provider. Document Released: 03/18/2005 Document Revised: 08/24/2015 Document Reviewed: 02/05/2015 Elsevier Interactive Patient Education  2018 [employer]  

## 2017-12-18 LAB — CYTOLOGY - PAP
DIAGNOSIS: NEGATIVE
HPV (WINDOPATH): NOT DETECTED

## 2018-01-07 DIAGNOSIS — R197 Diarrhea, unspecified: Secondary | ICD-10-CM | POA: Insufficient documentation

## 2018-01-07 DIAGNOSIS — R140 Abdominal distension (gaseous): Secondary | ICD-10-CM | POA: Insufficient documentation

## 2018-10-06 ENCOUNTER — Other Ambulatory Visit: Payer: Self-pay | Admitting: Obstetrics and Gynecology

## 2018-10-06 DIAGNOSIS — Z1231 Encounter for screening mammogram for malignant neoplasm of breast: Secondary | ICD-10-CM

## 2018-10-16 ENCOUNTER — Other Ambulatory Visit: Payer: Self-pay

## 2018-10-16 ENCOUNTER — Ambulatory Visit
Admission: RE | Admit: 2018-10-16 | Discharge: 2018-10-16 | Disposition: A | Discharge status: Home / Self Care | Payer: Commercial Managed Care - PPO | Source: Ambulatory Visit | Attending: Obstetrics and Gynecology | Admitting: Obstetrics and Gynecology

## 2018-10-16 DIAGNOSIS — Z1231 Encounter for screening mammogram for malignant neoplasm of breast: Secondary | ICD-10-CM

## 2018-11-16 ENCOUNTER — Ambulatory Visit (INDEPENDENT_AMBULATORY_CARE_PROVIDER_SITE_OTHER): Payer: Commercial Managed Care - PPO | Admitting: Family Medicine

## 2018-11-16 ENCOUNTER — Other Ambulatory Visit: Payer: Self-pay

## 2018-11-16 ENCOUNTER — Encounter: Payer: Self-pay | Admitting: Family Medicine

## 2018-11-16 VITALS — BP 136/84 | HR 97 | Temp 98.6°F | Resp 14 | Ht 61.0 in | Wt 145.6 lb

## 2018-11-16 DIAGNOSIS — L219 Seborrheic dermatitis, unspecified: Secondary | ICD-10-CM

## 2018-11-16 DIAGNOSIS — F411 Generalized anxiety disorder: Secondary | ICD-10-CM

## 2018-11-16 DIAGNOSIS — Z3041 Encounter for surveillance of contraceptive pills: Secondary | ICD-10-CM | POA: Diagnosis not present

## 2018-11-16 MED ORDER — TRIAMCINOLONE ACETONIDE 0.1 % EX CREA: 1.0000 "application " | TOPICAL_CREAM | Freq: Two times a day (BID) | CUTANEOUS | 0 refills | Status: DC

## 2018-11-16 MED ORDER — VENLAFAXINE HCL ER 37.5 MG PO CP24: 37.5000 mg | ORAL_CAPSULE | Freq: Every day | ORAL | 3 refills | Status: DC

## 2018-11-16 MED ORDER — CLONAZEPAM 1 MG PO TABS: 1.0000 mg | ORAL_TABLET | Freq: Every day | ORAL | 2 refills | Status: DC | PRN

## 2018-11-16 NOTE — Patient Instructions (Addendum)
It was so good seeing you again! Thank you for establishing with my new practice and allowing me to continue caring for you. It means a lot to me.   Please schedule a follow up appointment with me in 6 months for your annual complete physical; please come fasting.  I have refilled your medications.   Good luck with your job search.   Please call [facility] to schedule an appointment with Dr. Trey Paula; she is a therapist here at our [place] office.  The phone number is: [phone]
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
## 2018-12-31 ENCOUNTER — Other Ambulatory Visit: Payer: Self-pay

## 2018-12-31 ENCOUNTER — Ambulatory Visit (INDEPENDENT_AMBULATORY_CARE_PROVIDER_SITE_OTHER): Payer: Commercial Managed Care - PPO | Admitting: Obstetrics and Gynecology

## 2018-12-31 ENCOUNTER — Encounter: Payer: Self-pay | Admitting: Obstetrics and Gynecology

## 2018-12-31 VITALS — BP 118/64 | HR 80 | Temp 97.3°F | Ht 60.5 in | Wt 146.8 lb

## 2018-12-31 DIAGNOSIS — Z01419 Encounter for gynecological examination (general) (routine) without abnormal findings: Secondary | ICD-10-CM | POA: Diagnosis not present

## 2018-12-31 DIAGNOSIS — Z3041 Encounter for surveillance of contraceptive pills: Secondary | ICD-10-CM

## 2018-12-31 MED ORDER — LO LOESTRIN FE 1 MG-10 MCG / 10 MCG PO TABS: 1.0000 | ORAL_TABLET | Freq: Every day | ORAL | 3 refills | Status: DC

## 2018-12-31 NOTE — Progress Notes (Signed)
48 y.o. G0P0000 Divorced White or Caucasian Not Hispanic or Latino female here for annual exam.   She is on OCP's for cycle control, no cycles on LoLoestrin.   She has gained ~20 lbs with the stress of Covid. Not exercising.     No LMP recorded. (Menstrual status: Oral contraceptives).          Sexually active: No.  The current method of family planning is OCP (estrogen/progesterone).    Exercising: No.  The patient does not participate in regular exercise at present. Smoker:  no  Health Maintenance: Pap:  12/17/2017 WNL NEG HPV, 10-25-14 WNL NEG HR HPV  History of abnormal Pap:  no MMG: 10/16/2018 Birads 1 negative BMD:   N/A Colonoscopy: 1/20, negative (done for diarrhea which resolved). 1/20 endoscopy with 2 small ulcers (no treatment). TDaP:  11-22-14 Gardasil:  No   reports that she has never smoked. She has never used smokeless tobacco. She reports current alcohol use of about 1.0 - 2.0 standard drinks of alcohol per week. She reports that she does not use drugs. 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   Past Medical History:  Diagnosis Date  . Anxiety   . Diarrhea    2019 x 3 months; negative eval by integrative specialists and GI, nl EGD and colonoscopy  . Vitamin B12 deficiency   . Vitamin D deficiency     Past Surgical History:  Procedure Laterality Date  . WISDOM TOOTH EXTRACTION      Current Outpatient Medications  Medication Sig Dispense Refill  . clonazePAM (KLONOPIN) 1 MG tablet Take 1 tablet (1 mg total) by mouth daily as needed. 30 tablet 2  . Norethindrone-Ethinyl Estradiol-Fe Biphas (LO LOESTRIN FE) 1 MG-10 MCG / 10 MCG tablet Take 1 tablet by mouth daily. 84 tablet 3  . triamcinolone cream (KENALOG) 0.1 % Apply 1 application topically 2 (two) times daily. For 2 weeks, then as needed 45 g 0  . venlafaxine XR (EFFEXOR-XR) 37.5 MG 24 hr capsule Take 1 capsule (37.5 mg total) by mouth daily. 90 capsule 3   No current  facility-administered medications for this visit.     Family History  Problem Relation Age of Onset  . Hyperlipidemia Mother   . Hypertension Mother   . Thyroid disease Mother   . Breast cancer Paternal Aunt 34  . Breast cancer Maternal Grandmother 80  . Cancer Maternal Grandfather        lung, smoker and coal miner    Review of Systems  Constitutional: Negative.   HENT: Negative.   Eyes: Negative.   Respiratory: Negative.   Cardiovascular: Negative.   Gastrointestinal: Negative.   Endocrine: Negative.   Genitourinary: Negative.   Musculoskeletal: Negative.   Skin: Negative.   Allergic/Immunologic: Negative.   Neurological: Negative.   Hematological: Negative.   Psychiatric/Behavioral: Negative.     Exam:   BP 118/64 (BP Location: Right Arm, Patient Position: Sitting, Cuff Size: Normal)   Pulse 80   Temp (!) 97.3 F (36.3 C) (Skin)   Ht 5' 0.5" (1.537 m)   Wt 146 lb 12.8 oz (66.6 kg)   BMI 28.20 kg/m   Weight change: @WEIGHTCHANGE @ Height:   Height: 5' 0.5" (153.7 cm)  Ht Readings from Last 3 Encounters:  12/31/18 5' 0.5" (1.537 m)  11/16/18 5\' 1"  (1.549 m)  12/17/17 5' 0.5" (1.537 m)    General appearance: alert, cooperative and appears stated age Head: Normocephalic, without obvious abnormality, atraumatic Neck: no adenopathy,  supple, symmetrical, trachea midline and thyroid normal to inspection and palpation Lungs: clear to auscultation bilaterally Cardiovascular: regular rate and rhythm Breasts: normal appearance, no masses or tenderness Abdomen: soft, non-tender; non distended,  no masses,  no organomegaly Extremities: extremities normal, atraumatic, no cyanosis or edema Skin: Skin color, texture, turgor normal. No rashes or lesions Lymph nodes: Cervical, supraclavicular, and axillary nodes normal. No abnormal inguinal nodes palpated Neurologic: Grossly normal   Pelvic: External genitalia:  no lesions              Urethra:  normal appearing urethra  with no masses, tenderness or lesions              Bartholins and Skenes: normal                 Vagina: normal appearing vagina with normal color and discharge, no lesions              Cervix: no lesions               Bimanual Exam:  Uterus:  normal size, contour, position, consistency, mobility, non-tender              Adnexa: no mass, fullness, tenderness               Rectovaginal: Confirms               Anus:  normal sphincter tone, no lesions  Chaperone was present for exam.  A:  Well Woman with normal exam  On OCP's for cycle control  P:   Continue OCP's  Labs with primary and work  Mammogram is UTD  Discussed breast self exam  Discussed calcium and vit D intake

## 2018-12-31 NOTE — Patient Instructions (Signed)
EXERCISE AND DIET:  We recommended that you start or continue a regular exercise program for good health. Regular exercise means any activity that makes your heart beat faster and makes you sweat.  We recommend exercising at least 30 minutes per day at least 3 days a week, preferably 4 or 5.  We also recommend a diet low in fat and sugar.  Inactivity, poor dietary choices and obesity can cause diabetes, heart attack, stroke, and kidney damage, among others.    ALCOHOL AND SMOKING:  Women should limit their alcohol intake to no more than 7 drinks/beers/glasses of wine (combined, not each!) per week. Moderation of alcohol intake to this level decreases your risk of breast cancer and liver damage. And of course, no recreational drugs are part of a healthy lifestyle.  And absolutely no smoking or even second hand smoke. Most people know smoking can cause heart and lung diseases, but did you know it also contributes to weakening of your bones? Aging of your skin?  Yellowing of your teeth and nails?  CALCIUM AND VITAMIN D:  Adequate intake of calcium and Vitamin D are recommended.  The recommendations for exact amounts of these supplements seem to change often, but generally speaking 1,000 mg of calcium (between diet and supplement) and 800 units of Vitamin D per day seems prudent. Certain women may benefit from higher intake of Vitamin D.  If you are among these women, your doctor will have told you during your visit.    PAP SMEARS:  Pap smears, to check for cervical cancer or precancers,  have traditionally been done yearly, although recent scientific advances have shown that most women can have pap smears less often.  However, every woman still should have a physical exam from her gynecologist every year. It will include a breast check, inspection of the vulva and vagina to check for abnormal growths or skin changes, a visual exam of the cervix, and then an exam to evaluate the size and shape of the uterus and  ovaries.  And after 46 years of age, a rectal exam is indicated to check for rectal cancers. We will also provide age appropriate advice regarding health maintenance, like when you should have certain vaccines, screening for sexually transmitted diseases, bone density testing, colonoscopy, mammograms, etc.   MAMMOGRAMS:  All women over 40 years old should have a yearly mammogram. Many facilities now offer a "3D" mammogram, which may cost around $50 extra out of pocket. If possible,  we recommend you accept the option to have the 3D mammogram performed.  It both reduces the number of women who will be called back for extra views which then turn out to be normal, and it is better than the routine mammogram at detecting truly abnormal areas.    COLON CANCER SCREENING: Now recommend starting at age 45. At this time colonoscopy is not covered for routine screening until 50. There are take home tests that can be done between 45-49.   COLONOSCOPY:  Colonoscopy to screen for colon cancer is recommended for all women at age 50.  We know, you hate the idea of the prep.  We agree, BUT, having colon cancer and not knowing it is worse!!  Colon cancer so often starts as a polyp that can be seen and removed at colonscopy, which can quite literally save your life!  And if your first colonoscopy is normal and you have no family history of colon cancer, most women don't have to have it again for   10 years.  Once every ten years, you can do something that may end up saving your life, right?  We will be happy to help you get it scheduled when you are ready.  Be sure to check your insurance coverage so you understand how much it will cost.  It may be covered as a preventative service at no cost, but you should check your particular policy.      Breast Self-Awareness Breast self-awareness means being familiar with how your breasts look and feel. It involves checking your breasts regularly and reporting any changes to your  health care provider. Practicing breast self-awareness is important. A change in your breasts can be a sign of a serious medical problem. Being familiar with how your breasts look and feel allows you to find any problems early, when treatment is more likely to be successful. All women should practice breast self-awareness, including women who have had breast implants. How to do a breast self-exam One way to learn what is normal for your breasts and whether your breasts are changing is to do a breast self-exam. To do a breast self-exam: Look for Changes  1. Remove all the clothing above your waist. 2. Stand in front of a mirror in a room with good lighting. 3. Put your hands on your hips. 4. Push your hands firmly downward. 5. Compare your breasts in the mirror. Look for differences between them (asymmetry), such as: ? Differences in shape. ? Differences in size. ? Puckers, dips, and bumps in one breast and not the other. 6. Look at each breast for changes in your skin, such as: ? Redness. ? Scaly areas. 7. Look for changes in your nipples, such as: ? Discharge. ? Bleeding. ? Dimpling. ? Redness. ? A change in position. Feel for Changes Carefully feel your breasts for lumps and changes. It is best to do this while lying on your back on the floor and again while sitting or standing in the shower or tub with soapy water on your skin. Feel each breast in the following way:  Place the arm on the side of the breast you are examining above your head.  Feel your breast with the other hand.  Start in the nipple area and make  inch (2 cm) overlapping circles to feel your breast. Use the pads of your three middle fingers to do this. Apply light pressure, then medium pressure, then firm pressure. The light pressure will allow you to feel the tissue closest to the skin. The medium pressure will allow you to feel the tissue that is a little deeper. The firm pressure will allow you to feel the tissue  close to the ribs.  Continue the overlapping circles, moving downward over the breast until you feel your ribs below your breast.  Move one finger-width toward the center of the body. Continue to use the  inch (2 cm) overlapping circles to feel your breast as you move slowly up toward your collarbone.  Continue the up and down exam using all three pressures until you reach your armpit.  Write Down What You Find  Write down what is normal for each breast and any changes that you find. Keep a written record with breast changes or normal findings for each breast. By writing this information down, you do not need to depend only on memory for size, tenderness, or location. Write down where you are in your menstrual cycle, if you are still menstruating. If you are having trouble noticing differences   in your breasts, do not get discouraged. With time you will become more familiar with the variations in your breasts and more comfortable with the exam. How often should I examine my breasts? Examine your breasts every month. If you are breastfeeding, the best time to examine your breasts is after a feeding or after using a breast pump. If you menstruate, the best time to examine your breasts is 5-7 days after your period is over. During your period, your breasts are lumpier, and it may be more difficult to notice changes. When should I see my health care provider? See your health care provider if you notice:  A change in shape or size of your breasts or nipples.  A change in the skin of your breast or nipples, such as a reddened or scaly area.  Unusual discharge from your nipples.  A lump or thick area that was not there before.  Pain in your breasts.  Anything that concerns you.  

## 2019-01-27 ENCOUNTER — Ambulatory Visit (INDEPENDENT_AMBULATORY_CARE_PROVIDER_SITE_OTHER): Payer: Commercial Managed Care - PPO | Admitting: Family Medicine

## 2019-01-27 ENCOUNTER — Encounter: Payer: Self-pay | Admitting: Family Medicine

## 2019-01-27 VITALS — Temp 99.5°F

## 2019-01-27 DIAGNOSIS — U071 COVID-19: Secondary | ICD-10-CM
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
## 2019-02-12 ENCOUNTER — Encounter: Payer: Self-pay | Admitting: Family Medicine

## 2019-02-12 ENCOUNTER — Ambulatory Visit (INDEPENDENT_AMBULATORY_CARE_PROVIDER_SITE_OTHER): Payer: Commercial Managed Care - PPO | Admitting: Family Medicine

## 2019-02-12 VITALS — Temp 98.8°F | Ht 60.5 in | Wt 146.0 lb

## 2019-02-12 DIAGNOSIS — R059 Cough, unspecified: Secondary | ICD-10-CM

## 2019-02-12 DIAGNOSIS — R05 Cough: Secondary | ICD-10-CM

## 2019-02-12 MED ORDER — DOXYCYCLINE HYCLATE 100 MG PO TABS: 100.0000 mg | ORAL_TABLET | Freq: Two times a day (BID) | ORAL | 0 refills | Status: DC

## 2019-02-12 MED ORDER — PREDNISONE 50 MG PO TABS: ORAL_TABLET | ORAL | 0 refills | Status: DC

## 2019-02-12 NOTE — Telephone Encounter (Signed)
Virtual visit 

## 2019-02-12 NOTE — Progress Notes (Signed)
    Chief Complaint:  Katie Petersen is a 48 y.o. female who presents today for a virtual office visit with a chief complaint of cough.   Assessment/Plan:  Cough Concern for bacterial process.  Was recently diagnosed with Covid about 3 weeks ago and has had sudden recurrence of symptoms over the last couple of days.  Will start doxycycline to cover for bacterial pathology.  Also start prednisone.  Encouraged good oral hydration.  Discussed reasons to return to care and seek emergent care.  Follow-up as needed.    Subjective:  HPI:  Cough Started a few days ago.  She was diagnosed with covid 3 weeks ago.  Symptoms had improved for a few weeks until a few days ago.  Associated with headache and sore throat. Tried tylenol which has not helped. Some subjective fevers. No chills. Mildly increased shortness of breath. No chest pain. No wheezing.   ROS: Per HPI  PMH: She reports that she has never smoked. She has never used smokeless tobacco. She reports current alcohol use of about 1.0 - 2.0 standard drinks of alcohol per week. She reports that she does not use drugs.      Objective/Observations  Physical Exam: Gen: NAD, resting comfortably Pulm: Normal work of breathing Neuro: Grossly normal, moves all extremities Psych: Normal affect and thought content  No results found for this or any previous visit (from the past 24 hour(s)).   Virtual Visit via Video   I connected with Katie Petersen on 02/12/19 at 11:20 AM EST by a video enabled telemedicine application and verified that I am speaking with the correct person using two identifiers. I discussed the limitations of evaluation and management by telemedicine and the availability of in person appointments. The patient expressed understanding and agreed to proceed.   Patient location: Home Provider location: [place] participating in the virtual visit: Myself and Patient     Algis Greenhouse. Jerline Pain, MD 02/12/2019  11:30 AM

## 2019-02-15 ENCOUNTER — Other Ambulatory Visit: Payer: Self-pay

## 2019-02-15 ENCOUNTER — Encounter: Payer: Self-pay | Admitting: Family Medicine

## 2019-02-15 DIAGNOSIS — Z20822 Contact with and (suspected) exposure to covid-19: Secondary | ICD-10-CM

## 2019-02-15 DIAGNOSIS — R509 Fever, unspecified: Secondary | ICD-10-CM

## 2019-02-15 NOTE — Telephone Encounter (Signed)
Please write and send work note to her via [place]. Out of work this week due to febrile illness.  Thanks

## 2019-02-17 LAB — NOVEL CORONAVIRUS, NAA: SARS-CoV-2, NAA: NOT DETECTED

## 2019-02-18 ENCOUNTER — Encounter: Payer: Self-pay | Admitting: Family Medicine

## 2019-02-19 ENCOUNTER — Encounter: Payer: Self-pay | Admitting: Family Medicine

## 2019-02-19 ENCOUNTER — Ambulatory Visit (INDEPENDENT_AMBULATORY_CARE_PROVIDER_SITE_OTHER): Payer: Commercial Managed Care - PPO | Admitting: Family Medicine

## 2019-02-19 VITALS — Temp 99.5°F

## 2019-02-19 DIAGNOSIS — J208 Acute bronchitis due to other specified organisms: Secondary | ICD-10-CM

## 2019-02-19 DIAGNOSIS — B9689 Other specified bacterial agents as the cause of diseases classified elsewhere: Secondary | ICD-10-CM | POA: Diagnosis not present

## 2019-02-19 DIAGNOSIS — Z8619 Personal history of other infectious and parasitic diseases: Secondary | ICD-10-CM | POA: Diagnosis not present

## 2019-02-19 DIAGNOSIS — Z8616 Personal history of COVID-19: Secondary | ICD-10-CM
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
## 2019-05-19 ENCOUNTER — Encounter: Payer: Commercial Managed Care - PPO | Admitting: Family Medicine

## 2019-05-20 ENCOUNTER — Encounter: Payer: Commercial Managed Care - PPO | Admitting: Family Medicine

## 2019-06-16 ENCOUNTER — Encounter: Payer: Self-pay | Admitting: Family Medicine

## 2019-06-16 ENCOUNTER — Ambulatory Visit (INDEPENDENT_AMBULATORY_CARE_PROVIDER_SITE_OTHER): Payer: Commercial Managed Care - PPO | Admitting: Family Medicine

## 2019-06-16 ENCOUNTER — Other Ambulatory Visit: Payer: Self-pay

## 2019-06-16 VITALS — BP 138/82 | HR 83 | Temp 97.9°F | Ht 60.5 in | Wt 141.2 lb

## 2019-06-16 DIAGNOSIS — F411 Generalized anxiety disorder: Secondary | ICD-10-CM | POA: Diagnosis not present

## 2019-06-16 DIAGNOSIS — Z0000 Encounter for general adult medical examination without abnormal findings: Secondary | ICD-10-CM | POA: Diagnosis not present

## 2019-06-16 DIAGNOSIS — Z3041 Encounter for surveillance of contraceptive pills: Secondary | ICD-10-CM | POA: Diagnosis not present

## 2019-06-16 MED ORDER — BUSPIRONE HCL 7.5 MG PO TABS: 7.5000 mg | ORAL_TABLET | Freq: Two times a day (BID) | ORAL | 2 refills | Status: DC | PRN

## 2019-06-16 MED ORDER — VENLAFAXINE HCL ER 75 MG PO CP24: 75.0000 mg | ORAL_CAPSULE | Freq: Every day | ORAL | 3 refills | Status: DC
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
## 2019-06-16 NOTE — Patient Instructions (Addendum)
Please return in 6 months for anxiety recheck.   I've ordered your mood medications from your mail order pharmacy. Increase the effexor to '75mg'$  daily and try the buspar as needed.   If you have any questions or concerns, please don't hesitate to send me a message via MyChart or call the office at [phone]. Thank you for visiting with Katie Petersen today! It's our pleasure caring for you.   Stress, Adult Stress is a normal reaction to life events. Stress is what you feel when life demands more than you are used to, or more than you think you can handle. Some stress can be useful, such as studying for a test or meeting a deadline at work. Stress that occurs too often or for too long can cause problems. It can affect your emotional health and interfere with relationships and normal daily activities. Too much stress can weaken your body's defense system (immune system) and increase your risk for physical illness. If you already have a medical problem, stress can make it worse. What are the causes? All sorts of life events can cause stress. An event that causes stress for one person may not be stressful for another person. Major life events, whether positive or negative, commonly cause stress. Examples include:  Losing a job or starting a new job.  Losing a loved one.  Moving to a new town or home.  Getting married or divorced.  Having a baby.  Getting injured or sick. Less obvious life events can also cause stress, especially if they occur day after day or in combination with each other. Examples include:  Working long hours.  Driving in traffic.  Caring for children.  Being in debt.  Being in a difficult relationship. What are the signs or symptoms? Stress can cause emotional symptoms, including:  Anxiety. This is feeling worried, afraid, on edge, overwhelmed, or out of control.  Anger, including irritation or impatience.  Depression. This is feeling sad, down, helpless, or  guilty.  Trouble focusing, remembering, or making decisions. Stress can cause physical symptoms, including:  Aches and pains. These may affect your head, neck, back, stomach, or other areas of your body.  Tight muscles or a clenched jaw.  Low energy.  Trouble sleeping. Stress can cause unhealthy behaviors, including:  Eating to feel better (overeating) or skipping meals.  Working too much or putting off tasks.  Smoking, drinking alcohol, or using drugs to feel better. How is this diagnosed? Stress is diagnosed through an assessment by your health care provider. He or she may diagnose this condition based on:  Your symptoms and any stressful life events.  Your medical history.  Tests to rule out other causes of your symptoms. Depending on your condition, your health care provider may refer you to a specialist for further evaluation. How is this treated?  Stress management techniques are the recommended treatment for stress. Medicine is not typically recommended for the treatment of stress. Techniques to reduce your reaction to stressful life events include:  Stress identification. Monitor yourself for symptoms of stress and identify what causes stress for you. These skills may help you to avoid or prepare for stressful events.  Time management. Set your priorities, keep a calendar of events, and learn to say no. Taking these actions can help you avoid making too many commitments. Techniques for coping with stress include:  Rethinking the problem. Try to think realistically about stressful events rather than ignoring them or overreacting. Try to find the positives in a stressful  situation rather than focusing on the negatives.  Exercise. Physical exercise can release both physical and emotional tension. The key is to find a form of exercise that you enjoy and do it regularly.  Relaxation techniques. These relax the body and mind. The key is to find one or more that you enjoy  and use the techniques regularly. Examples include: ? Meditation, deep breathing, or progressive relaxation techniques. ? Yoga or tai chi. ? Biofeedback, mindfulness techniques, or journaling. ? Listening to music, being out in nature, or participating in other hobbies.  Practicing a healthy lifestyle. Eat a balanced diet, drink plenty of water, limit or avoid caffeine, and get plenty of sleep.  Having a strong support network. Spend time with family, friends, or other people you enjoy being around. Express your feelings and talk things over with someone you trust. Counseling or talk therapy with a mental health professional may be helpful if you are having trouble managing stress on your own. Follow these instructions at home: Lifestyle   Avoid drugs.  Do not use any products that contain nicotine or tobacco, such as cigarettes, e-cigarettes, and chewing tobacco. If you need help quitting, ask your health care provider.  Limit alcohol intake to no more than 1 drink a day for nonpregnant women and 2 drinks a day for men. One drink equals 12 oz of beer, 5 oz of wine, or 1 oz of hard liquor  Do not use alcohol or drugs to relax.  Eat a balanced diet that includes fresh fruits and vegetables, whole grains, lean meats, fish, eggs, and beans, and low-fat dairy. Avoid processed foods and foods high in added fat, sugar, and salt.  Exercise at least 30 minutes on 5 or more days each week.  Get 7-8 hours of sleep each night. General instructions   Practice stress management techniques as discussed with your health care provider.  Drink enough fluid to keep your urine clear or pale yellow.  Take over-the-counter and prescription medicines only as told by your health care provider.  Keep all follow-up visits as told by your health care provider. This is important. Contact a health care provider if:  Your symptoms get worse.  You have new symptoms.  You feel overwhelmed by your  problems and can no longer manage them on your own. Get help right away if:  You have thoughts of hurting yourself or others. If you ever feel like you may hurt yourself or others, or have thoughts about taking your own life, get help right away. You can go to your nearest emergency department or call:  Your local emergency services (911 in the [place]  A suicide crisis helpline, such as the [place] at 7744968011. This is open 24 hours a day. Summary  Stress is a normal reaction to life events. It can cause problems if it happens too often or for too long.  Practicing stress management techniques is the best way to treat stress.  Counseling or talk therapy with a mental health professional may be helpful if you are having trouble managing stress on your own. This information is not intended to replace advice given to you by your health care provider. Make sure you discuss any questions you have with your health care provider. Document Revised: 10/16/2018 Document Reviewed: 05/08/2016 Elsevier Patient Education  [place].

## 2019-06-26 ENCOUNTER — Ambulatory Visit: Payer: Commercial Managed Care - PPO | Attending: Internal Medicine

## 2019-06-26 DIAGNOSIS — Z23 Encounter for immunization: Secondary | ICD-10-CM

## 2019-06-26 NOTE — Progress Notes (Signed)
   Covid-19 Vaccination Clinic  Name:  Katie Petersen    MRN: [id number] DOB: 26-May-1970  06/26/2019  [name] was observed post Covid-19 immunization for 15 minutes without incident. She was provided with Vaccine Information Sheet and instruction to access the V-Safe system.   Ms. Few was instructed to call 911 with any severe reactions post vaccine: Marland Kitchen Difficulty breathing  . Swelling of face and throat  . A fast heartbeat  . A bad rash all over body  . Dizziness and weakness   Immunizations Administered    Name Date Dose VIS Date Route   Pfizer COVID-19 Vaccine 06/26/2019  3:31 PM 0.3 mL 03/12/2019 Intramuscular   Manufacturer: [employer], [employer]   Lot: [id number]   NDC: [address]-5

## 2019-07-06 ENCOUNTER — Encounter: Payer: Self-pay | Admitting: Family Medicine

## 2019-07-06 MED ORDER — TRAZODONE HCL 50 MG PO TABS: 25.0000 mg | ORAL_TABLET | Freq: Every evening | ORAL | 3 refills | Status: DC | PRN

## 2019-07-07 ENCOUNTER — Other Ambulatory Visit: Payer: Self-pay

## 2019-07-21 ENCOUNTER — Ambulatory Visit: Payer: Commercial Managed Care - PPO | Attending: Internal Medicine

## 2019-07-21 DIAGNOSIS — Z23 Encounter for immunization: Secondary | ICD-10-CM

## 2019-07-21 NOTE — Progress Notes (Signed)
   Covid-19 Vaccination Clinic  Name:  Elfreida Heggs    MRN: [id number] DOB: [birth year]  07/21/2019  Ms. Crossman was observed post Covid-19 immunization for 15 minutes without incident. She was provided with Vaccine Information Sheet and instruction to access the V-Safe system.   Ms. Plato was instructed to call 911 with any severe reactions post vaccine: Marland Kitchen Difficulty breathing  . Swelling of face and throat  . A fast heartbeat  . A bad rash all over body  . Dizziness and weakness   Immunizations Administered    Name Date Dose VIS Date Route   Pfizer COVID-19 Vaccine 07/21/2019  4:18 PM 0.3 mL 05/26/2018 Intramuscular   Manufacturer: [employer], [employer]   Lot: [id number]   NDC: [address]-3

## 2019-09-01 ENCOUNTER — Other Ambulatory Visit: Payer: Self-pay | Admitting: Family Medicine

## 2019-09-18 ENCOUNTER — Other Ambulatory Visit: Payer: Self-pay | Admitting: Family Medicine

## 2019-09-25 ENCOUNTER — Other Ambulatory Visit: Payer: Self-pay | Admitting: Obstetrics and Gynecology

## 2019-12-17 ENCOUNTER — Ambulatory Visit: Payer: Commercial Managed Care - PPO | Admitting: Family Medicine

## 2019-12-21 ENCOUNTER — Encounter: Payer: Self-pay | Admitting: Family Medicine

## 2019-12-21 ENCOUNTER — Telehealth (INDEPENDENT_AMBULATORY_CARE_PROVIDER_SITE_OTHER): Payer: Commercial Managed Care - PPO | Admitting: Family Medicine

## 2019-12-21 ENCOUNTER — Other Ambulatory Visit: Payer: Self-pay

## 2019-12-21 DIAGNOSIS — L219 Seborrheic dermatitis, unspecified: Secondary | ICD-10-CM | POA: Diagnosis not present

## 2019-12-21 DIAGNOSIS — N951 Menopausal and female climacteric states: Secondary | ICD-10-CM

## 2019-12-21 DIAGNOSIS — R030 Elevated blood-pressure reading, without diagnosis of hypertension: Secondary | ICD-10-CM

## 2019-12-21 DIAGNOSIS — F411 Generalized anxiety disorder: Secondary | ICD-10-CM

## 2019-12-21 MED ORDER — TRIAMCINOLONE ACETONIDE 0.1 % EX CREA: 1.0000 "application " | TOPICAL_CREAM | Freq: Two times a day (BID) | CUTANEOUS | 1 refills | Status: DC
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
## 2019-12-24 ENCOUNTER — Encounter: Payer: Self-pay | Admitting: Family Medicine

## 2020-01-06 ENCOUNTER — Ambulatory Visit (INDEPENDENT_AMBULATORY_CARE_PROVIDER_SITE_OTHER): Payer: Commercial Managed Care - PPO | Admitting: Obstetrics and Gynecology

## 2020-01-06 ENCOUNTER — Encounter: Payer: Self-pay | Admitting: Obstetrics and Gynecology

## 2020-01-06 ENCOUNTER — Other Ambulatory Visit: Payer: Self-pay

## 2020-01-06 VITALS — BP 122/64 | HR 98 | Ht 60.5 in | Wt 148.0 lb

## 2020-01-06 DIAGNOSIS — L292 Pruritus vulvae: Secondary | ICD-10-CM | POA: Diagnosis not present

## 2020-01-06 DIAGNOSIS — Z3041 Encounter for surveillance of contraceptive pills: Secondary | ICD-10-CM

## 2020-01-06 DIAGNOSIS — N951 Menopausal and female climacteric states: Secondary | ICD-10-CM

## 2020-01-06 DIAGNOSIS — Z01419 Encounter for gynecological examination (general) (routine) without abnormal findings: Secondary | ICD-10-CM

## 2020-01-06 MED ORDER — LO LOESTRIN FE 1 MG-10 MCG / 10 MCG PO TABS: 1.0000 | ORAL_TABLET | Freq: Every day | ORAL | 3 refills | Status: DC

## 2020-01-06 NOTE — Patient Instructions (Signed)
Perimenopause  Perimenopause is the normal time of life before and after menstrual periods stop completely (menopause). Perimenopause can begin 2-8 years before menopause, and it usually lasts for 1 year after menopause. During perimenopause, the ovaries may or may not produce an egg. What are the causes? This condition is caused by a natural change in hormone levels that happens as you get older. What increases the risk? This condition is more likely to start at an earlier age if you have certain medical conditions or treatments, including:  A tumor of the pituitary gland in the brain.  A disease that affects the ovaries and hormone production.  Radiation treatment for cancer.  Certain cancer treatments, such as chemotherapy or hormone (anti-estrogen) therapy.  Heavy smoking and excessive alcohol use.  Family history of early menopause. What are the signs or symptoms? Perimenopausal changes affect each woman differently. Symptoms of this condition may include:  Hot flashes.  Night sweats.  Irregular menstrual periods.  Decreased sex drive.  Vaginal dryness.  Headaches.  Mood swings.  Depression.  Memory problems or trouble concentrating.  Irritability.  Tiredness.  Weight gain.  Anxiety.  Trouble getting pregnant. How is this diagnosed? This condition is diagnosed based on your medical history, a physical exam, your age, your menstrual history, and your symptoms. Hormone tests may also be done. How is this treated? In some cases, no treatment is needed. You and your health care provider should make a decision together about whether treatment is necessary. Treatment will be based on your individual condition and preferences. Various treatments are available, such as:  Menopausal hormone therapy (MHT).  Medicines to treat specific symptoms.  Acupuncture.  Vitamin or herbal supplements. Before starting treatment, make sure to let your health care provider  know if you have a personal or family history of:  Heart disease.  Breast cancer.  Blood clots.  Diabetes.  Osteoporosis. Follow these instructions at home: Lifestyle  Do not use any products that contain nicotine or tobacco, such as cigarettes and e-cigarettes. If you need help quitting, ask your health care provider.  Eat a balanced diet that includes fresh fruits and vegetables, whole grains, soybeans, eggs, lean meat, and low-fat dairy.  Get at least 30 minutes of physical activity on 5 or more days each week.  Avoid alcoholic and caffeinated beverages, as well as spicy foods. This may help prevent hot flashes.  Get 7-8 hours of sleep each night.  Dress in layers that can be removed to help you manage hot flashes.  Find ways to manage stress, such as deep breathing, meditation, or journaling. General instructions  Keep track of your menstrual periods, including: ? When they occur. ? How heavy they are and how long they last. ? How much time passes between periods.  Keep track of your symptoms, noting when they start, how often you have them, and how long they last.  Take over-the-counter and prescription medicines only as told by your health care provider.  Take vitamin supplements only as told by your health care provider. These may include calcium, vitamin E, and vitamin D.  Use vaginal lubricants or moisturizers to help with vaginal dryness and improve comfort during sex.  Talk with your health care provider before starting any herbal supplements.  Keep all follow-up visits as told by your health care provider. This is important. This includes any group therapy or counseling. Contact a health care provider if:  You have heavy vaginal bleeding or pass blood clots.  Your period   lasts more than 2 days longer than normal.  Your periods are recurring sooner than 21 days.  You bleed after having sex. Get help right away if:  You have chest pain, trouble  breathing, or trouble talking.  You have severe depression.  You have pain when you urinate.  You have severe headaches.  You have vision problems. Summary  Perimenopause is the time when a woman's body begins to move into menopause. This may happen naturally or as a result of other health problems or medical treatments.  Perimenopause can begin 2-8 years before menopause, and it usually lasts for 1 year after menopause.  Perimenopausal symptoms can be managed through medicines, lifestyle changes, and complementary therapies such as acupuncture. This information is not intended to replace advice given to you by your health care provider. Make sure you discuss any questions you have with your health care provider. Document Revised: 02/28/2017 Document Reviewed: 04/23/2016 Elsevier Patient Education  2020 [employer] Inc.   EXERCISE AND DIET:  We recommended that you start or continue a regular exercise program for good health. Regular exercise means any activity that makes your heart beat faster and makes you sweat.  We recommend exercising at least 30 minutes per day at least 3 days a week, preferably 4 or 5.  We also recommend a diet low in fat and sugar.  Inactivity, poor dietary choices and obesity can cause diabetes, heart attack, stroke, and kidney damage, among others.    ALCOHOL AND SMOKING:  Women should limit their alcohol intake to no more than 7 drinks/beers/glasses of wine (combined, not each!) per week. Moderation of alcohol intake to this level decreases your risk of breast cancer and liver damage. And of course, no recreational drugs are part of a healthy lifestyle.  And absolutely no smoking or even second hand smoke. Most people know smoking can cause heart and lung diseases, but did you know it also contributes to weakening of your bones? Aging of your skin?  Yellowing of your teeth and nails?  CALCIUM AND VITAMIN D:  Adequate intake of calcium and Vitamin D are recommended.   The recommendations for exact amounts of these supplements seem to change often, but generally speaking 1,000 mg of calcium (between diet and supplement) and 800 units of Vitamin D per day seems prudent. Certain women may benefit from higher intake of Vitamin D.  If you are among these women, your doctor will have told you during your visit.    PAP SMEARS:  Pap smears, to check for cervical cancer or precancers,  have traditionally been done yearly, although recent scientific advances have shown that most women can have pap smears less often.  However, every woman still should have a physical exam from her gynecologist every year. It will include a breast check, inspection of the vulva and vagina to check for abnormal growths or skin changes, a visual exam of the cervix, and then an exam to evaluate the size and shape of the uterus and ovaries.  And after 47 years of age, a rectal exam is indicated to check for rectal cancers. We will also provide age appropriate advice regarding health maintenance, like when you should have certain vaccines, screening for sexually transmitted diseases, bone density testing, colonoscopy, mammograms, etc.   MAMMOGRAMS:  All women over 40 years old should have a yearly mammogram. Many facilities now offer a "3D" mammogram, which may cost around $50 extra out of pocket. If possible,  we recommend you accept the option   to have the 3D mammogram performed.  It both reduces the number of women who will be called back for extra views which then turn out to be normal, and it is better than the routine mammogram at detecting truly abnormal areas.    COLON CANCER SCREENING: Now recommend starting at age 45. At this time colonoscopy is not covered for routine screening until 50. There are take home tests that can be done between 45-49.   COLONOSCOPY:  Colonoscopy to screen for colon cancer is recommended for all women at age 50.  We know, you hate the idea of the prep.  We agree, BUT,  having colon cancer and not knowing it is worse!!  Colon cancer so often starts as a polyp that can be seen and removed at colonscopy, which can quite literally save your life!  And if your first colonoscopy is normal and you have no family history of colon cancer, most women don't have to have it again for 10 years.  Once every ten years, you can do something that may end up saving your life, right?  We will be happy to help you get it scheduled when you are ready.  Be sure to check your insurance coverage so you understand how much it will cost.  It may be covered as a preventative service at no cost, but you should check your particular policy.      Breast Self-Awareness Breast self-awareness means being familiar with how your breasts look and feel. It involves checking your breasts regularly and reporting any changes to your health care provider. Practicing breast self-awareness is important. A change in your breasts can be a sign of a serious medical problem. Being familiar with how your breasts look and feel allows you to find any problems early, when treatment is more likely to be successful. All women should practice breast self-awareness, including women who have had breast implants. How to do a breast self-exam One way to learn what is normal for your breasts and whether your breasts are changing is to do a breast self-exam. To do a breast self-exam: Look for Changes  1. Remove all the clothing above your waist. 2. Stand in front of a mirror in a room with good lighting. 3. Put your hands on your hips. 4. Push your hands firmly downward. 5. Compare your breasts in the mirror. Look for differences between them (asymmetry), such as: ? Differences in shape. ? Differences in size. ? Puckers, dips, and bumps in one breast and not the other. 6. Look at each breast for changes in your skin, such as: ? Redness. ? Scaly areas. 7. Look for changes in your nipples, such  as: ? Discharge. ? Bleeding. ? Dimpling. ? Redness. ? A change in position. Feel for Changes Carefully feel your breasts for lumps and changes. It is best to do this while lying on your back on the floor and again while sitting or standing in the shower or tub with soapy water on your skin. Feel each breast in the following way:  Place the arm on the side of the breast you are examining above your head.  Feel your breast with the other hand.  Start in the nipple area and make  inch (2 cm) overlapping circles to feel your breast. Use the pads of your three middle fingers to do this. Apply light pressure, then medium pressure, then firm pressure. The light pressure will allow you to feel the tissue closest to the skin. The   medium pressure will allow you to feel the tissue that is a little deeper. The firm pressure will allow you to feel the tissue close to the ribs.  Continue the overlapping circles, moving downward over the breast until you feel your ribs below your breast.  Move one finger-width toward the center of the body. Continue to use the  inch (2 cm) overlapping circles to feel your breast as you move slowly up toward your collarbone.  Continue the up and down exam using all three pressures until you reach your armpit.  Write Down What You Find  Write down what is normal for each breast and any changes that you find. Keep a written record with breast changes or normal findings for each breast. By writing this information down, you do not need to depend only on memory for size, tenderness, or location. Write down where you are in your menstrual cycle, if you are still menstruating. If you are having trouble noticing differences in your breasts, do not get discouraged. With time you will become more familiar with the variations in your breasts and more comfortable with the exam. How often should I examine my breasts? Examine your breasts every month. If you are breastfeeding, the  best time to examine your breasts is after a feeding or after using a breast pump. If you menstruate, the best time to examine your breasts is 5-7 days after your period is over. During your period, your breasts are lumpier, and it may be more difficult to notice changes. When should I see my health care provider? See your health care provider if you notice:  A change in shape or size of your breasts or nipples.  A change in the skin of your breast or nipples, such as a reddened or scaly area.  Unusual discharge from your nipples.  A lump or thick area that was not there before.  Pain in your breasts.  Anything that concerns you.  

## 2020-01-06 NOTE — Progress Notes (Signed)
49 y.o. G0P0000 Divorced White or Caucasian Not Hispanic or Latino female here for annual exam.  Has been having some vaginal itching. On OCP's, no cycles.  She is having some perimenopausal breakthrough symptoms. She c/o occasional hot flashes, some sleep disturbance, some mild night sweats.   She reports intermittent vulvar itching, occurs a few x a week. No discharge.   She has gained weight.     She said she had a few visits at urgent care her systolic BP was up.   No LMP recorded. (Menstrual status: Oral contraceptives).          Sexually active: No.  The current method of family planning is OCP (estrogen/progesterone).    Exercising: Yes.    Walking  Smoker:  no  Health Maintenance: Pap: 12/17/2017 WNL NEG HPV, 10-25-14 WNL NEG HR HPV  History of abnormal Pap:  no MMG:  10/19/18 Bi-rads 1 neg Density C  BMD:   Never  Colonoscopy: 1/20, negative (done for diarrhea which resolved). 1/20 endoscopy with 2 small ulcers (no treatment) TDaP:  11/22/14 Gardasil: no    reports that she has never smoked. She has never used smokeless tobacco. She reports current alcohol use of about 1.0 - 2.0 standard drink of alcohol per week. She reports that she does not use drugs. Pharmacist in long term care, working with Covid patients.  Past Medical History:  Diagnosis Date  . Anxiety   . Diarrhea    2019 x 3 months; negative eval by integrative specialists and GI, nl EGD and colonoscopy  . Vitamin B12 deficiency   . Vitamin D deficiency     Past Surgical History:  Procedure Laterality Date  . WISDOM TOOTH EXTRACTION      Current Outpatient Medications  Medication Sig Dispense Refill  . busPIRone (BUSPAR) 7.5 MG tablet TAKE 1 TABLET TWICE A DAY  AS NEEDED FOR ANXIETY 180 tablet 3  . clonazePAM (KLONOPIN) 1 MG tablet Take 1 tablet (1 mg total) by mouth daily as needed. 30 tablet 2  . Norethindrone-Ethinyl Estradiol-Fe Biphas (LO LOESTRIN FE) 1 MG-10 MCG / 10 MCG tablet Take 1 tablet by  mouth daily. 84 tablet 3  . traZODone (DESYREL) 50 MG tablet Take 0.5-1 tablets (25-50 mg total) by mouth at bedtime as needed for sleep. 30 tablet 3  . triamcinolone cream (KENALOG) 0.1 % Apply 1 application topically 2 (two) times daily. For 2 weeks, then as needed 45 g 1  . venlafaxine XR (EFFEXOR-XR) 75 MG 24 hr capsule Take 1 capsule (75 mg total) by mouth daily. 90 capsule 3   No current facility-administered medications for this visit.    Family History  Problem Relation Age of Onset  . Hyperlipidemia Mother   . Hypertension Mother   . Thyroid disease Mother   . Breast cancer Paternal Aunt 25  . Breast cancer Maternal Grandmother 80  . Cancer Maternal Grandfather        lung, smoker and coal miner    Review of Systems  Genitourinary:       Vaginal itching   All other systems reviewed and are negative.   Exam:   BP 122/64   Pulse 98   Ht 5' 0.5" (1.537 m)   Wt 148 lb (67.1 kg)   SpO2 97%   BMI 28.43 kg/m   Weight change: @WEIGHTCHANGE @ Height:   Height: 5' 0.5" (153.7 cm)  Ht Readings from Last 3 Encounters:  01/06/20 5' 0.5" (1.537 m)  06/16/19 5' 0.5" (1.537  m)  02/12/19 5' 0.5" (1.537 m)    General appearance: alert, cooperative and appears stated age Head: Normocephalic, without obvious abnormality, atraumatic Neck: no adenopathy, supple, symmetrical, trachea midline and thyroid normal to inspection and palpation Lungs: clear to auscultation bilaterally Cardiovascular: regular rate and rhythm Breasts: normal appearance, no masses or tenderness Abdomen: soft, non-tender; non distended,  no masses,  no organomegaly Extremities: extremities normal, atraumatic, no cyanosis or edema Skin: Skin color, texture, turgor normal. No rashes or lesions Lymph nodes: Cervical, supraclavicular, and axillary nodes normal. No abnormal inguinal nodes palpated Neurologic: Grossly normal   Pelvic: External genitalia:  no lesions              Urethra:  normal appearing  urethra with no masses, tenderness or lesions              Bartholins and Skenes: normal                 Vagina: normal appearing vagina with a slight increase in thin, white vaginal discharge              Cervix: no lesions               Bimanual Exam:  Uterus:  normal size, contour, position, consistency, mobility, non-tender              Adnexa: no mass, fullness, tenderness               Rectovaginal: Confirms               Anus:  normal sphincter tone, no lesions  Katie Petersen chaperoned for the exam.  A:  Well Woman with normal exam  She reports a few random elevated BP readings at Urgent care. She will monitor. She is aware if her BP is elevated she will need to come off of OCP's.    Occasional vulvar itching, discussed vulvar skin care.   P:   No pap this year  Will do labs with her primary  Continue OCP's  Mammogram overdue, will scheduled   Discussed breast self exam  Discussed calcium and vit D intake  Nuswab sent

## 2020-01-08 LAB — NUSWAB BV AND CANDIDA, NAA
Candida albicans, NAA: NEGATIVE
Candida glabrata, NAA: NEGATIVE

## 2020-01-14 ENCOUNTER — Other Ambulatory Visit: Payer: Self-pay | Admitting: Obstetrics and Gynecology

## 2020-01-14 DIAGNOSIS — Z1231 Encounter for screening mammogram for malignant neoplasm of breast: Secondary | ICD-10-CM

## 2020-01-17 ENCOUNTER — Ambulatory Visit
Admission: RE | Admit: 2020-01-17 | Discharge: 2020-01-17 | Disposition: A | Discharge status: Home / Self Care | Payer: Commercial Managed Care - PPO | Source: Ambulatory Visit | Attending: Obstetrics and Gynecology | Admitting: Obstetrics and Gynecology

## 2020-01-17 ENCOUNTER — Other Ambulatory Visit: Payer: Self-pay

## 2020-01-17 DIAGNOSIS — Z1231 Encounter for screening mammogram for malignant neoplasm of breast: Secondary | ICD-10-CM

## 2020-01-21 ENCOUNTER — Other Ambulatory Visit: Payer: Self-pay | Admitting: Obstetrics and Gynecology

## 2020-01-21 DIAGNOSIS — R928 Other abnormal and inconclusive findings on diagnostic imaging of breast: Secondary | ICD-10-CM

## 2020-02-02 ENCOUNTER — Encounter: Payer: Self-pay | Admitting: Family Medicine

## 2020-02-03 ENCOUNTER — Other Ambulatory Visit: Payer: Self-pay

## 2020-02-03 MED ORDER — TRAZODONE HCL 50 MG PO TABS
25.0000 mg | ORAL_TABLET | Freq: Every evening | ORAL | 3 refills | Status: DC | PRN
Start: 2020-02-03 — End: 2020-05-15

## 2020-02-07 ENCOUNTER — Other Ambulatory Visit: Payer: Self-pay

## 2020-02-07 ENCOUNTER — Ambulatory Visit: Payer: Commercial Managed Care - PPO

## 2020-02-07 ENCOUNTER — Ambulatory Visit
Admission: RE | Admit: 2020-02-07 | Discharge: 2020-02-07 | Disposition: A | Discharge status: Home / Self Care | Payer: Commercial Managed Care - PPO | Source: Ambulatory Visit | Attending: Obstetrics and Gynecology | Admitting: Obstetrics and Gynecology

## 2020-02-07 DIAGNOSIS — R928 Other abnormal and inconclusive findings on diagnostic imaging of breast: Secondary | ICD-10-CM

## 2020-02-09 ENCOUNTER — Other Ambulatory Visit (HOSPITAL_COMMUNITY): Payer: Self-pay | Admitting: Internal Medicine

## 2020-02-09 ENCOUNTER — Ambulatory Visit: Payer: Commercial Managed Care - PPO | Attending: Internal Medicine

## 2020-02-09 DIAGNOSIS — Z23 Encounter for immunization: Secondary | ICD-10-CM

## 2020-02-09 NOTE — Progress Notes (Signed)
   Covid-19 Vaccination Clinic  Name:  Jezabelle Chisolm    MRN: [id number] DOB: 09-09-1970  02/09/2020  Ms. Transue was observed post Covid-19 immunization for 15 minutes without incident. She was provided with Vaccine Information Sheet and instruction to access the V-Safe system.   Ms. Iten was instructed to call 911 with any severe reactions post vaccine: Marland Kitchen Difficulty breathing  . Swelling of face and throat  . A fast heartbeat  . A bad rash all over body  . Dizziness and weakness

## 2020-05-14 ENCOUNTER — Other Ambulatory Visit: Payer: Self-pay | Admitting: Family Medicine

## 2020-05-16 ENCOUNTER — Other Ambulatory Visit: Payer: Self-pay | Admitting: Family Medicine

## 2020-05-28 ENCOUNTER — Other Ambulatory Visit: Payer: Self-pay | Admitting: Family Medicine

## 2020-06-16 ENCOUNTER — Encounter: Payer: Self-pay | Admitting: Family Medicine

## 2020-06-16 ENCOUNTER — Ambulatory Visit (INDEPENDENT_AMBULATORY_CARE_PROVIDER_SITE_OTHER): Payer: Commercial Managed Care - PPO | Admitting: Family Medicine

## 2020-06-16 ENCOUNTER — Other Ambulatory Visit: Payer: Self-pay

## 2020-06-16 VITALS — BP 120/76 | HR 85 | Temp 98.4°F | Resp 17 | Ht 61.0 in | Wt 152.0 lb

## 2020-06-16 DIAGNOSIS — F411 Generalized anxiety disorder: Secondary | ICD-10-CM | POA: Diagnosis not present

## 2020-06-16 DIAGNOSIS — Z3041 Encounter for surveillance of contraceptive pills: Secondary | ICD-10-CM

## 2020-06-16 DIAGNOSIS — Z0000 Encounter for general adult medical examination without abnormal findings: Secondary | ICD-10-CM

## 2020-06-16 MED ORDER — CLONAZEPAM 1 MG PO TABS: 1.0000 mg | ORAL_TABLET | Freq: Every day | ORAL | 0 refills | Status: DC | PRN

## 2020-06-16 MED ORDER — VENLAFAXINE HCL ER 75 MG PO CP24: 75.0000 mg | ORAL_CAPSULE | Freq: Every day | ORAL | 3 refills | Status: DC
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
## 2020-06-16 NOTE — Patient Instructions (Addendum)
Please return in 12 months for your annual complete physical; please come fasting. Please schedule a fasting lab appointment.  I will release your lab results to you on your MyChart account with further instructions. Please reply with any questions.   If you have any questions or concerns, please don't hesitate to send me a message via MyChart or call the office at [phone]. Thank you for visiting with [place] today! It's our pleasure caring for you.

## 2020-07-25 ENCOUNTER — Other Ambulatory Visit: Payer: Self-pay

## 2020-07-25 ENCOUNTER — Other Ambulatory Visit (INDEPENDENT_AMBULATORY_CARE_PROVIDER_SITE_OTHER): Payer: Commercial Managed Care - PPO

## 2020-07-25 DIAGNOSIS — Z0000 Encounter for general adult medical examination without abnormal findings: Secondary | ICD-10-CM | POA: Diagnosis not present

## 2020-07-25 LAB — COMPREHENSIVE METABOLIC PANEL
ALT: 21 U/L (ref 0–35)
AST: 17 U/L (ref 0–37)
Albumin: 4.3 g/dL (ref 3.5–5.2)
Alkaline Phosphatase: 88 U/L (ref 39–117)
BUN: 11 mg/dL (ref 6–23)
CO2: 25 mEq/L (ref 19–32)
Calcium: 9.7 mg/dL (ref 8.4–10.5)
Chloride: 103 mEq/L (ref 96–112)
Creatinine, Ser: 0.78 mg/dL (ref 0.40–1.20)
GFR: 89.91 mL/min (ref >60.00)
Glucose, Bld: 96 mg/dL (ref 70–99)
Potassium: 4.1 mEq/L (ref 3.5–5.1)
Sodium: 138 mEq/L (ref 135–145)
Total Bilirubin: 0.4 mg/dL (ref 0.2–1.2)
Total Protein: 7.7 g/dL (ref 6.0–8.3)

## 2020-07-25 LAB — CBC WITH DIFFERENTIAL/PLATELET
Basophils Absolute: 0 10*3/uL (ref 0.0–0.1)
Basophils Relative: 0.6 % (ref 0.0–3.0)
Eosinophils Absolute: 0.2 10*3/uL (ref 0.0–0.7)
Eosinophils Relative: 3.1 % (ref 0.0–5.0)
HCT: 39.8 % (ref 36.0–46.0)
Hemoglobin: 13.4 g/dL (ref 12.0–15.0)
Lymphocytes Relative: 28.2 % (ref 12.0–46.0)
Lymphs Abs: 1.8 10*3/uL (ref 0.7–4.0)
MCHC: 33.7 g/dL (ref 30.0–36.0)
MCV: 88.5 fl (ref 78.0–100.0)
Monocytes Absolute: 0.3 10*3/uL (ref 0.1–1.0)
Monocytes Relative: 4.8 % (ref 3.0–12.0)
Neutro Abs: 4 10*3/uL (ref 1.4–7.7)
Neutrophils Relative %: 63.3 % (ref 43.0–77.0)
Platelets: 359 10*3/uL (ref 150.0–400.0)
RBC: 4.5 Mil/uL (ref 3.87–5.11)
RDW: 13.4 % (ref 11.5–15.5)
WBC: 6.3 10*3/uL (ref 4.0–10.5)

## 2020-07-25 LAB — TSH: TSH: 2.32 u[IU]/mL (ref 0.35–4.50)

## 2020-07-25 LAB — LIPID PANEL
Cholesterol: 214 mg/dL — ABNORMAL HIGH (ref 0–200)
HDL: 54.3 mg/dL (ref >39.00)
LDL Cholesterol: 141 mg/dL — ABNORMAL HIGH (ref 0–99)
NonHDL: 159.26
Total CHOL/HDL Ratio: 4
Triglycerides: 92 mg/dL (ref 0.0–149.0)
VLDL: 18.4 mg/dL (ref 0.0–40.0)

## 2020-07-25 LAB — VITAMIN D 25 HYDROXY (VIT D DEFICIENCY, FRACTURES): VITD: 37.98 ng/mL (ref 30.00–100.00)

## 2020-07-26 ENCOUNTER — Ambulatory Visit
Admission: RE | Admit: 2020-07-26 | Discharge: 2020-07-26 | Disposition: A | Discharge status: Home / Self Care | Payer: Commercial Managed Care - PPO | Source: Ambulatory Visit | Attending: Gastroenterology | Admitting: Gastroenterology

## 2020-07-26 ENCOUNTER — Other Ambulatory Visit: Payer: Self-pay | Admitting: Gastroenterology

## 2020-07-26 DIAGNOSIS — R197 Diarrhea, unspecified: Secondary | ICD-10-CM

## 2020-07-26 LAB — HEPATITIS C ANTIBODY
Hepatitis C Ab: NONREACTIVE
SIGNAL TO CUT-OFF: 0.01 (ref <1.00)

## 2020-09-30 ENCOUNTER — Other Ambulatory Visit: Payer: Self-pay | Admitting: Obstetrics and Gynecology

## 2020-10-03 NOTE — Telephone Encounter (Signed)
Annual exam scheduled on 01/24/21

## 2020-12-01 ENCOUNTER — Other Ambulatory Visit: Payer: Self-pay | Admitting: *Deleted

## 2020-12-01 MED ORDER — LO LOESTRIN FE 1 MG-10 MCG / 10 MCG PO TABS: 1.0000 | ORAL_TABLET | Freq: Every day | ORAL | 0 refills | Status: DC

## 2020-12-01 NOTE — Telephone Encounter (Signed)
Patient called requesting refill on lo Loestrin FE 1/10 mcg tablet,has annual exam scheduled on 01/24/21, hast 1 pack left. last mammogram 12/2019. Patient uses [facility] so she will need a 3 months supply.

## 2021-01-23 NOTE — Progress Notes (Signed)
50 y.o. G0P0000 Divorced White or Caucasian Not Hispanic or Latino female here for annual exam. On low dose OCP's, no cycles. Not sexually active.  No bowel or bladder issues.      No LMP recorded. (Menstrual status: Oral contraceptives).          Sexually active: No.  The current method of family planning is OCP (estrogen/progesterone).    Exercising: Yes.     Yoga and walking  Smoker:  no  Health Maintenance: Pap:  12/17/2017 WNL NEG HPV, 10-25-14 WNL NEG HR HPV   History of abnormal Pap:  no MMG:  02/07/20 Bi-rads 1 neg  BMD:   none  Colonoscopy: 1/20, negative (done for diarrhea which resolved). 1/20 endoscopy with 2 small ulcers (no treatment) TDaP:  11/22/14  Gardasil: no    reports that she has never smoked. She has never used smokeless tobacco. She reports current alcohol use of about 1.0 - 2.0 standard drink per week. She reports that she does not use drugs.Pharmacist in long term care  Past Medical History:  Diagnosis Date   Anxiety    Diarrhea    2019 x 3 months; negative eval by integrative specialists and GI, nl EGD and colonoscopy   Vitamin B12 deficiency    Vitamin D deficiency     Past Surgical History:  Procedure Laterality Date   WISDOM TOOTH EXTRACTION      Current Outpatient Medications  Medication Sig Dispense Refill   Norethindrone-Ethinyl Estradiol-Fe Biphas (LO LOESTRIN FE) 1 MG-10 MCG / 10 MCG tablet Take 1 tablet by mouth daily. 84 tablet 0   No current facility-administered medications for this visit.    Family History  Problem Relation Age of Onset   Hyperlipidemia Mother    Hypertension Mother    Thyroid disease Mother    Breast cancer Paternal Aunt 41   Breast cancer Maternal Grandmother 7   Cancer Maternal Grandfather        lung, smoker and coal miner    Review of Systems  All other systems reviewed and are negative.  Exam:   BP 130/70   Pulse 76   Ht 5\' 1"  (1.549 m)   Wt 134 lb (60.8 kg)   SpO2 99%   BMI 25.32 kg/m   Weight  change: @WEIGHTCHANGE @ Height:   Height: 5\' 1"  (154.9 cm)  Ht Readings from Last 3 Encounters:  01/24/21 5\' 1"  (1.549 m)  06/16/20 5\' 1"  (1.549 m)  01/06/20 5' 0.5" (1.537 m)    General appearance: alert, cooperative and appears stated age Head: Normocephalic, without obvious abnormality, atraumatic Neck: no adenopathy, supple, symmetrical, trachea midline and thyroid normal to inspection and palpation Lungs: clear to auscultation bilaterally Cardiovascular: regular rate and rhythm Breasts: normal appearance, no masses or tenderness Abdomen: soft, non-tender; non distended,  no masses,  no organomegaly Extremities: extremities normal, atraumatic, no cyanosis or edema Skin: Skin color, texture, turgor normal. No rashes or lesions Lymph nodes: Cervical, supraclavicular, and axillary nodes normal. No abnormal inguinal nodes palpated Neurologic: Grossly normal   Pelvic: External genitalia:  no lesions              Urethra:  normal appearing urethra with no masses, tenderness or lesions              Bartholins and Skenes: normal                 Vagina: normal appearing vagina with normal color and discharge, no lesions  Cervix: no lesions               Bimanual Exam:  Uterus:  normal size, contour, position, consistency, mobility, non-tender              Adnexa: no mass, fullness, tenderness               Rectovaginal: Confirms               Anus:  normal sphincter tone, no lesions   1. Well woman exam Discussed breast self exam Discussed calcium and vit D intake  No pap this year Colonoscopy UTD Mammogram due next month  2. Encounter for surveillance of contraceptive pills Doing well - Norethindrone-Ethinyl Estradiol-Fe Biphas (LO LOESTRIN FE) 1 MG-10 MCG / 10 MCG tablet; Take 1 tablet by mouth daily.  Dispense: 84 tablet; Refill: 3

## 2021-01-24 ENCOUNTER — Other Ambulatory Visit: Payer: Self-pay | Admitting: Obstetrics and Gynecology

## 2021-01-24 ENCOUNTER — Ambulatory Visit (INDEPENDENT_AMBULATORY_CARE_PROVIDER_SITE_OTHER): Payer: Commercial Managed Care - PPO | Admitting: Obstetrics and Gynecology

## 2021-01-24 ENCOUNTER — Encounter: Payer: Self-pay | Admitting: Obstetrics and Gynecology

## 2021-01-24 ENCOUNTER — Other Ambulatory Visit: Payer: Self-pay

## 2021-01-24 VITALS — BP 130/70 | HR 76 | Ht 61.0 in | Wt 134.0 lb

## 2021-01-24 DIAGNOSIS — Z3041 Encounter for surveillance of contraceptive pills: Secondary | ICD-10-CM | POA: Diagnosis not present

## 2021-01-24 DIAGNOSIS — Z01419 Encounter for gynecological examination (general) (routine) without abnormal findings: Secondary | ICD-10-CM

## 2021-01-24 DIAGNOSIS — Z1231 Encounter for screening mammogram for malignant neoplasm of breast: Secondary | ICD-10-CM

## 2021-01-24 MED ORDER — LO LOESTRIN FE 1 MG-10 MCG / 10 MCG PO TABS: 1.0000 | ORAL_TABLET | Freq: Every day | ORAL | 3 refills | Status: DC

## 2021-01-24 NOTE — Patient Instructions (Signed)

## 2021-02-19 ENCOUNTER — Ambulatory Visit
Admission: RE | Admit: 2021-02-19 | Discharge: 2021-02-19 | Disposition: A | Discharge status: Home / Self Care | Payer: Commercial Managed Care - PPO | Source: Ambulatory Visit | Attending: Obstetrics and Gynecology | Admitting: Obstetrics and Gynecology

## 2021-02-19 DIAGNOSIS — Z1231 Encounter for screening mammogram for malignant neoplasm of breast: Secondary | ICD-10-CM

## 2021-03-02 ENCOUNTER — Ambulatory Visit: Payer: Commercial Managed Care - PPO

## 2021-03-09 ENCOUNTER — Other Ambulatory Visit (HOSPITAL_BASED_OUTPATIENT_CLINIC_OR_DEPARTMENT_OTHER): Payer: Self-pay

## 2021-03-09 ENCOUNTER — Ambulatory Visit: Payer: Commercial Managed Care - PPO | Attending: Internal Medicine

## 2021-03-09 DIAGNOSIS — Z23 Encounter for immunization: Secondary | ICD-10-CM

## 2021-03-09 MED ORDER — PFIZER COVID-19 VAC BIVALENT 30 MCG/0.3ML IM SUSP
INTRAMUSCULAR | 0 refills | Status: DC
  Filled 2021-03-09: qty 0.3, 1d supply, fill #0

## 2021-03-09 NOTE — Progress Notes (Signed)
   Covid-19 Vaccination Clinic  Name:  Katie Petersen    MRN: [id number] DOB: 10-23-1970  03/09/2021  [name] was observed post Covid-19 immunization for 15 minutes without incident. She was provided with Vaccine Information Sheet and instruction to access the V-Safe system.   [name] was instructed to call 911 with any severe reactions post vaccine: Difficulty breathing  Swelling of face and throat  A fast heartbeat  A bad rash all over body  Dizziness and weakness   Immunizations Administered     Name Date Dose VIS Date Route   Pfizer Covid-19 Vaccine Bivalent Booster 03/09/2021  9:57 AM 0.3 mL 11/29/2020 Intramuscular   Manufacturer: [employer], [employer]   Lot: [id number]   NDC: [phone]

## 2021-03-11 ENCOUNTER — Other Ambulatory Visit: Payer: Self-pay | Admitting: Family Medicine

## 2021-03-12 NOTE — Telephone Encounter (Signed)
LVM for patient return call 

## 2021-03-12 NOTE — Telephone Encounter (Signed)
Patient states she does not need this medication, or any refills. Believes this medication is on auto refill with [facility]

## 2021-06-19 ENCOUNTER — Encounter: Payer: Commercial Managed Care - PPO | Admitting: Family Medicine

## 2021-08-09 ENCOUNTER — Telehealth: Payer: Self-pay | Admitting: *Deleted

## 2021-08-09 NOTE — Telephone Encounter (Signed)
Patient called back reports she has [employer]. I patient reports she has BCP transferred to [facility] [phone]. I called pharmacy and spoke with [facility] and PA was sent via cover my med, PA done, pending response from [facility]. ?

## 2021-08-09 NOTE — Telephone Encounter (Signed)
Left message for patient to call to confirm insurance. ?

## 2021-08-09 NOTE — Telephone Encounter (Signed)
-----   Message from Leamon Arnt, [place] sent at 08/08/2021  9:38 AM EDT ----- ?Patient is wanting a prior auth for her birth control ? ?

## 2021-08-13 NOTE — Telephone Encounter (Signed)
Medication approved from 08/09/21-08/10/22. Pharmacy informed as well. ?

## 2021-08-16 ENCOUNTER — Encounter: Payer: Self-pay | Admitting: Family Medicine

## 2021-08-16 ENCOUNTER — Ambulatory Visit (INDEPENDENT_AMBULATORY_CARE_PROVIDER_SITE_OTHER): Payer: PRIVATE HEALTH INSURANCE | Admitting: Family Medicine

## 2021-08-16 VITALS — BP 124/74 | HR 83 | Temp 98.3°F | Ht 61.0 in | Wt 139.6 lb

## 2021-08-16 DIAGNOSIS — Z0000 Encounter for general adult medical examination without abnormal findings: Secondary | ICD-10-CM

## 2021-08-16 DIAGNOSIS — Z3041 Encounter for surveillance of contraceptive pills: Secondary | ICD-10-CM | POA: Diagnosis not present

## 2021-08-16 DIAGNOSIS — L219 Seborrheic dermatitis, unspecified: Secondary | ICD-10-CM

## 2021-08-16 DIAGNOSIS — F411 Generalized anxiety disorder: Secondary | ICD-10-CM

## 2021-08-16 DIAGNOSIS — B351 Tinea unguium: Secondary | ICD-10-CM | POA: Diagnosis not present

## 2021-08-16 DIAGNOSIS — K9041 Non-celiac gluten sensitivity: Secondary | ICD-10-CM | POA: Diagnosis not present

## 2021-08-16 MED ORDER — TERBINAFINE HCL 250 MG PO TABS: 250.0000 mg | ORAL_TABLET | Freq: Every day | ORAL | 1 refills | Status: DC

## 2021-08-16 MED ORDER — TRIAMCINOLONE ACETONIDE 0.1 % EX CREA: 1.0000 "application " | TOPICAL_CREAM | Freq: Two times a day (BID) | CUTANEOUS | 1 refills | Status: DC

## 2021-08-16 NOTE — Patient Instructions (Signed)
Please return in 12 months for your annual complete physical; please come fasting. Schedule a fasting lab appointment please.  I will release your lab results to you on your MyChart account with further instructions. You may see the results before I do, but when I review them I will send you a message with my report or have my assistant call you if things need to be discussed. Please reply to my message with any questions. Thank you!   If you have any questions or concerns, please don't hesitate to send me a message via MyChart or call the office at [phone]. Thank you for visiting with Katie Petersen today! It's our pleasure caring for you.   Glad you are well!! Good luck with the new job.
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
## 2021-08-21 ENCOUNTER — Other Ambulatory Visit (INDEPENDENT_AMBULATORY_CARE_PROVIDER_SITE_OTHER): Payer: PRIVATE HEALTH INSURANCE

## 2021-08-21 DIAGNOSIS — Z0000 Encounter for general adult medical examination without abnormal findings: Secondary | ICD-10-CM

## 2021-08-21 LAB — COMPREHENSIVE METABOLIC PANEL
ALT: 16 U/L (ref 0–35)
AST: 14 U/L (ref 0–37)
Albumin: 4.2 g/dL (ref 3.5–5.2)
Alkaline Phosphatase: 58 U/L (ref 39–117)
BUN: 9 mg/dL (ref 6–23)
CO2: 25 mEq/L (ref 19–32)
Calcium: 9.5 mg/dL (ref 8.4–10.5)
Chloride: 106 mEq/L (ref 96–112)
Creatinine, Ser: 0.68 mg/dL (ref 0.40–1.20)
GFR: 102.32 mL/min (ref >60.00)
Glucose, Bld: 88 mg/dL (ref 70–99)
Potassium: 3.9 mEq/L (ref 3.5–5.1)
Sodium: 140 mEq/L (ref 135–145)
Total Bilirubin: 0.5 mg/dL (ref 0.2–1.2)
Total Protein: 7.1 g/dL (ref 6.0–8.3)

## 2021-08-21 LAB — CBC WITH DIFFERENTIAL/PLATELET
Basophils Absolute: 0 10*3/uL (ref 0.0–0.1)
Basophils Relative: 0.5 % (ref 0.0–3.0)
Eosinophils Absolute: 0.2 10*3/uL (ref 0.0–0.7)
Eosinophils Relative: 2.2 % (ref 0.0–5.0)
HCT: 40.4 % (ref 36.0–46.0)
Hemoglobin: 13.6 g/dL (ref 12.0–15.0)
Lymphocytes Relative: 29.5 % (ref 12.0–46.0)
Lymphs Abs: 2.1 10*3/uL (ref 0.7–4.0)
MCHC: 33.7 g/dL (ref 30.0–36.0)
MCV: 90.1 fl (ref 78.0–100.0)
Monocytes Absolute: 0.4 10*3/uL (ref 0.1–1.0)
Monocytes Relative: 5.3 % (ref 3.0–12.0)
Neutro Abs: 4.4 10*3/uL (ref 1.4–7.7)
Neutrophils Relative %: 62.5 % (ref 43.0–77.0)
Platelets: 294 10*3/uL (ref 150.0–400.0)
RBC: 4.49 Mil/uL (ref 3.87–5.11)
RDW: 13.1 % (ref 11.5–15.5)
WBC: 7 10*3/uL (ref 4.0–10.5)

## 2021-08-21 LAB — LIPID PANEL
Cholesterol: 191 mg/dL (ref 0–200)
HDL: 54.3 mg/dL (ref >39.00)
LDL Cholesterol: 115 mg/dL — ABNORMAL HIGH (ref 0–99)
NonHDL: 136.9
Total CHOL/HDL Ratio: 4
Triglycerides: 109 mg/dL (ref 0.0–149.0)
VLDL: 21.8 mg/dL (ref 0.0–40.0)

## 2021-08-21 LAB — TSH: TSH: 3.21 u[IU]/mL (ref 0.35–5.50)

## 2021-10-23 ENCOUNTER — Encounter: Payer: Self-pay | Admitting: Obstetrics and Gynecology

## 2021-10-23 ENCOUNTER — Ambulatory Visit (INDEPENDENT_AMBULATORY_CARE_PROVIDER_SITE_OTHER): Payer: PRIVATE HEALTH INSURANCE | Admitting: Obstetrics and Gynecology

## 2021-10-23 VITALS — BP 124/72 | HR 67 | Ht 61.0 in | Wt 140.8 lb

## 2021-10-23 DIAGNOSIS — R4586 Emotional lability: Secondary | ICD-10-CM | POA: Diagnosis not present

## 2021-10-23 DIAGNOSIS — R5383 Other fatigue: Secondary | ICD-10-CM | POA: Diagnosis not present

## 2021-10-23 DIAGNOSIS — R102 Pelvic and perineal pain: Secondary | ICD-10-CM | POA: Diagnosis not present

## 2021-10-23 DIAGNOSIS — R232 Flushing: Secondary | ICD-10-CM
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
## 2021-11-01 ENCOUNTER — Ambulatory Visit (INDEPENDENT_AMBULATORY_CARE_PROVIDER_SITE_OTHER): Payer: PRIVATE HEALTH INSURANCE | Admitting: Podiatry

## 2021-11-01 DIAGNOSIS — L603 Nail dystrophy: Secondary | ICD-10-CM

## 2021-11-03 NOTE — Progress Notes (Signed)
  Subjective:  Patient ID: Katie Petersen, female    DOB: 25-Jun-1970,  MRN: [id number] HPI Chief Complaint  Patient presents with   Nail Problem    Toenails - thick, discolored x several months, PCP Rx'd lamisil x 3 months, insurance not covering anymore, not active, but nails look bruised at times   New Patient (Initial Visit)    51 y.o. female presents with the above complaint.   ROS: Denies fever chills nausea vomiting muscle aches pains calf pain back pain chest pain shortness of breath.  Past Medical History:  Diagnosis Date   Anxiety    Diarrhea    2019 x 3 months; negative eval by integrative specialists and GI, nl EGD and colonoscopy   Vitamin B12 deficiency    Vitamin D deficiency    Past Surgical History:  Procedure Laterality Date   WISDOM TOOTH EXTRACTION      Current Outpatient Medications:    Norethindrone-Ethinyl Estradiol-Fe Biphas (LO LOESTRIN FE) 1 MG-10 MCG / 10 MCG tablet, Take 1 tablet by mouth daily., Disp: 84 tablet, Rfl: 3   terbinafine (LAMISIL) 250 MG tablet, Take 1 tablet (250 mg total) by mouth daily., Disp: 90 tablet, Rfl: 1   triamcinolone cream (KENALOG) 0.1 %, Apply 1 application. topically 2 (two) times daily. For 2 weeks, then as needed, Disp: 45 g, Rfl: 1  Allergies  Allergen Reactions   Gluten Meal     Other reaction(s): Other Migraines   Review of Systems Objective:  There were no vitals filed for this visit.  General: Well developed, nourished, in no acute distress, alert and oriented x3   Dermatological: Skin is warm, dry and supple bilateral. Nails x 10 are well maintained; some nails do demonstrate a nail dystrophy with thickening yellow discoloration and subungual debris.  Remaining integument appears unremarkable at this time. There are no open sores, no preulcerative lesions, no rash or signs of infection present.  Vascular: Dorsalis Pedis artery and Posterior Tibial artery pedal pulses are 2/4 bilateral with immedate capillary  fill time. Pedal hair growth present. No varicosities and no lower extremity edema present bilateral.   Neruologic: Grossly intact via light touch bilateral. Vibratory intact via tuning fork bilateral. Protective threshold with Semmes Wienstein monofilament intact to all pedal sites bilateral. Patellar and Achilles deep tendon reflexes 2+ bilateral. No Babinski or clonus noted bilateral.   Musculoskeletal: No gross boney pedal deformities bilateral. No pain, crepitus, or limitation noted with foot and ankle range of motion bilateral. Muscular strength 5/5 in all groups tested bilateral.  Gait: Unassisted, Nonantalgic.    Radiographs:  None taken  Assessment & Plan:   Assessment: Nail dystrophy has been taking Lamisil for 3 months with little to no improvement  Plan: Samples of skin and nail today be sent for pathologic evaluation follow-up with her once those command usually takes about a month.     Katie Petersen T. [place], [place]

## 2021-11-06 ENCOUNTER — Ambulatory Visit (INDEPENDENT_AMBULATORY_CARE_PROVIDER_SITE_OTHER): Payer: PRIVATE HEALTH INSURANCE | Admitting: Family Medicine

## 2021-11-06 ENCOUNTER — Encounter: Payer: Self-pay | Admitting: Family Medicine

## 2021-11-06 VITALS — BP 124/78 | HR 86 | Temp 98.6°F | Ht 61.0 in | Wt 140.8 lb

## 2021-11-06 DIAGNOSIS — R519 Headache, unspecified: Secondary | ICD-10-CM | POA: Diagnosis not present

## 2021-11-06 DIAGNOSIS — R5383 Other fatigue: Secondary | ICD-10-CM

## 2021-11-06 DIAGNOSIS — N951 Menopausal and female climacteric states: Secondary | ICD-10-CM

## 2021-11-06 NOTE — Patient Instructions (Signed)
Please return in 4-6 weeks if symptoms persist.  I will release your lab results to you on your MyChart account with further instructions. You may see the results before I do, but when I review them I will send you a message with my report or have my assistant call you if things need to be discussed. Please reply to my message with any questions. Thank you!   If you have any questions or concerns, please don't hesitate to send me a message via MyChart or call the office at [phone]. Thank you for visiting with [place] today! It's our pleasure caring for you.

## 2021-11-06 NOTE — Addendum Note (Signed)
Addended by: Lorn Junes on: 11/06/2021 02:43 PM   Modules accepted: Orders
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
## 2021-11-26 ENCOUNTER — Other Ambulatory Visit: Payer: Self-pay

## 2021-11-26 MED ORDER — NORETHIN-ETH ESTRAD-FE BIPHAS 1 MG-10 MCG / 10 MCG PO TABS: 1.0000 | ORAL_TABLET | Freq: Every day | ORAL | 0 refills | Status: DC

## 2021-11-26 NOTE — Telephone Encounter (Signed)
Patient said there is a delay in getting her bcp Rx from the mail order pharmacy. She is asking if you will send one pack to her local pharmacy that she will pay out of pocket for so that she is not delayed starting her next pack.

## 2021-12-13 ENCOUNTER — Ambulatory Visit: Payer: PRIVATE HEALTH INSURANCE | Admitting: Podiatry

## 2021-12-24 ENCOUNTER — Encounter: Payer: Self-pay | Admitting: *Deleted

## 2021-12-31 ENCOUNTER — Other Ambulatory Visit: Payer: Self-pay | Admitting: Obstetrics and Gynecology

## 2022-01-02 ENCOUNTER — Encounter: Payer: Self-pay | Admitting: Podiatry

## 2022-01-08 ENCOUNTER — Encounter: Payer: Self-pay | Admitting: Podiatry

## 2022-01-08 ENCOUNTER — Ambulatory Visit (INDEPENDENT_AMBULATORY_CARE_PROVIDER_SITE_OTHER): Payer: PRIVATE HEALTH INSURANCE | Admitting: Podiatry

## 2022-01-08 DIAGNOSIS — L603 Nail dystrophy: Secondary | ICD-10-CM

## 2022-01-08 MED ORDER — TERBINAFINE HCL 250 MG PO TABS: 250.0000 mg | ORAL_TABLET | Freq: Every day | ORAL | 0 refills | Status: DC

## 2022-01-08 NOTE — Patient Instructions (Signed)
Terbinafine Tablets What is this medication? TERBINAFINE (TER bin a feen) treats fungal infections of the nails. It belongs to a group of medications called antifungals. It will not treat infections caused by bacteria or viruses. This medicine may be used for other purposes; ask your health care provider or pharmacist if you have questions. COMMON BRAND NAME(S): Lamisil, Terbinex What should I tell my care team before I take this medication? They need to know if you have any of these conditions: Liver disease An unusual or allergic reaction to terbinafine, other medications, foods, dyes, or preservatives Pregnant or trying to get pregnant Breast-feeding How should I use this medication? Take this medication by mouth with water. Take it as directed on the prescription label at the same time every day. You can take it with or without food. If it upsets your stomach, take it with food. Keep taking it unless your care team tells you to stop. A special MedGuide will be given to you by the pharmacist with each prescription and refill. Be sure to read this information carefully each time. Talk to your care team regarding the use of this medication in children. Special care may be needed. Overdosage: If you think you have taken too much of this medicine contact a poison control center or emergency room at once. NOTE: This medicine is only for you. Do not share this medicine with others. What if I miss a dose? If you miss a dose, take it as soon as you can unless it is more than 4 hours late. If it is more than 4 hours late, skip the missed dose. Take the next dose at the normal time. What may interact with this medication? Do not take this medication with any of the following: Pimozide Thioridazine This medication may also interact with the following: Beta blockers Caffeine Certain medications for mental health conditions Cimetidine Cyclosporine Medications for fungal infections like fluconazole  and ketoconazole Medications for irregular heartbeat like amiodarone, flecainide and propafenone Rifampin Warfarin This list may not describe all possible interactions. Give your health care provider a list of all the medicines, herbs, non-prescription drugs, or dietary supplements you use. Also tell them if you smoke, drink alcohol, or use illegal drugs. Some items may interact with your medicine. What should I watch for while using this medication? Visit your care team for regular checks on your progress. You may need blood work while you are taking this medication. It may be some time before you see the benefit from this medication. This medication may cause serious skin reactions. They can happen weeks to months after starting the medication. Contact your care team right away if you notice fevers or flu-like symptoms with a rash. The rash may be red or purple and then turn into blisters or peeling of the skin. Or, you might notice a red rash with swelling of the face, lips or lymph nodes in your neck or under your arms. This medication can make you more sensitive to the sun. Keep out of the sun, If you cannot avoid being in the sun, wear protective clothing and sunscreen. Do not use sun lamps or tanning beds/booths. What side effects may I notice from receiving this medication? Side effects that you should report to your care team as soon as possible: Allergic reactions--skin rash, itching, hives, swelling of the face, lips, tongue, or throat Change in sense of smell Change in taste Infection--fever, chills, cough, or sore throat Liver injury--right upper belly pain, loss of appetite, nausea,   light-colored stool, dark yellow or brown urine, yellowing skin or eyes, unusual weakness or fatigue Low red blood cell level--unusual weakness or fatigue, dizziness, headache, trouble breathing Lupus-like syndrome--joint pain, swelling, or stiffness, butterfly-shaped rash on the face, rashes that get worse  in the sun, fever, unusual weakness or fatigue Rash, fever, and swollen lymph nodes Redness, blistering, peeling, or loosening of the skin, including inside the mouth Unusual bruising or bleeding Worsening mood, feelings of depression Side effects that usually do not require medical attention (report to your care team if they continue or are bothersome): Diarrhea Gas Headache Nausea Stomach pain Upset stomach This list may not describe all possible side effects. Call your doctor for medical advice about side effects. You may report side effects to FDA at 1-800-FDA-1088. Where should I keep my medication? Keep out of the reach of children and pets. Store between 20 and 25 degrees C (68 and 77 degrees F). Protect from light. Get rid of any unused medication after the expiration date. To get rid of medications that are no longer needed or have expired: Take the medication to a medication take-back program. Check with your pharmacy or law enforcement to find a location. If you cannot return the medication, check the label or package insert to see if the medication should be thrown out in the garbage or flushed down the toilet. If you are not sure, ask your care team. If it is safe to put it in the trash, take the medication out of the container. Mix the medication with cat litter, dirt, coffee grounds, or other unwanted substance. Seal the mixture in a bag or container. Put it in the trash. NOTE: This sheet is a summary. It may not cover all possible information. If you have questions about this medicine, talk to your doctor, pharmacist, or health care provider.  2023 Elsevier/Gold Standard (2020-10-10 00:00:00)  

## 2022-01-08 NOTE — Progress Notes (Signed)
She presents today for her pathology reports she has been on Lamisil since May of this year.  She states that she has noticed that the toenail is slow to grow out but it is demonstrating some clearing.  She denies any problems taking the medication.  Objective: Vital signs are stable she is alert and oriented x3.  Pathology that was taken does demonstrate not only onychomycosis but it also demonstrates nail dystrophy.  Assessment: Nail dystrophy onychomycosis.  Plan: Since she has been on the medicine for so long we are going to request blood work consisting of a comprehensive metabolic panel.  I also would like for her to go ahead and start her on another 90 days of medication should her blood work come back abnormal I will notify her.  She has been on this medication since May 2023 from her primary care provider and we will continue to this medication and perform blood work.

## 2022-01-23 NOTE — Progress Notes (Deleted)
51 y.o. G0P0000 Divorced White or Caucasian Not Hispanic or Latino female here for annual exam.      No LMP recorded. (Menstrual status: Oral contraceptives).          Sexually active: {yes no:314532}  The current method of family planning is {contraception:315051}.    Exercising: {yes no:314532}  {types:19826} Smoker:  {YES P5382123  Health Maintenance: Pap:   12/17/2017 WNL NEG HPV, 10-25-14 WNL NEG HR HPV   History of abnormal Pap:  no MMG:  02/19/21 density C Bi-rads 1 neg BMD:   n/a Colonoscopy: 1/20, negative (done for diarrhea which resolved). 1/20 endoscopy with 2 small ulcers (no treatment) TDaP:  11/22/14  Gardasil: no    reports that she has never smoked. She has never used smokeless tobacco. She reports current alcohol use of about 1.0 - 2.0 standard drink of alcohol per week. She reports that she does not use drugs.  Past Medical History:  Diagnosis Date   Anxiety    Diarrhea    2019 x 3 months; negative eval by integrative specialists and GI, nl EGD and colonoscopy   Vitamin B12 deficiency    Vitamin D deficiency     Past Surgical History:  Procedure Laterality Date   WISDOM TOOTH EXTRACTION      Current Outpatient Medications  Medication Sig Dispense Refill   Norethindrone-Ethinyl Estradiol-Fe Biphas (LO LOESTRIN FE) 1 MG-10 MCG / 10 MCG tablet Take 1 tablet by mouth daily. 84 tablet 3   Norethindrone-Ethinyl Estradiol-Fe Biphas (LO LOESTRIN FE) 1 MG-10 MCG / 10 MCG tablet Take 1 tablet by mouth daily. 28 tablet 0   terbinafine (LAMISIL) 250 MG tablet Take 1 tablet (250 mg total) by mouth daily. 90 tablet 0   triamcinolone cream (KENALOG) 0.1 % Apply 1 application. topically 2 (two) times daily. For 2 weeks, then as needed 45 g 1   No current facility-administered medications for this visit.    Family History  Problem Relation Age of Onset   Hyperlipidemia Mother    Hypertension Mother    Thyroid disease Mother    Breast cancer Paternal Aunt 13   Breast  cancer Maternal Grandmother 55   Cancer Maternal Grandfather        lung, smoker and coal miner    Review of Systems  Exam:   There were no vitals taken for this visit.  Weight change: @WEIGHTCHANGE @ Height:      Ht Readings from Last 3 Encounters:  11/06/21 5\' 1"  (1.549 m)  10/23/21 5\' 1"  (1.549 m)  08/16/21 5\' 1"  (1.549 m)    General appearance: alert, cooperative and appears stated age Head: Normocephalic, without obvious abnormality, atraumatic Neck: no adenopathy, supple, symmetrical, trachea midline and thyroid {CHL AMB PHY EX THYROID NORM [phone]::"normal to inspection and palpation"} Lungs: clear to auscultation bilaterally Cardiovascular: regular rate and rhythm Breasts: {Exam; breast:13139::"normal appearance, no masses or tenderness"} Abdomen: soft, non-tender; non distended,  no masses,  no organomegaly Extremities: extremities normal, atraumatic, no cyanosis or edema Skin: Skin color, texture, turgor normal. No rashes or lesions Lymph nodes: Cervical, supraclavicular, and axillary nodes normal. No abnormal inguinal nodes palpated Neurologic: Grossly normal   Pelvic: External genitalia:  no lesions              Urethra:  normal appearing urethra with no masses, tenderness or lesions              Bartholins and Skenes: normal  Vagina: normal appearing vagina with normal color and discharge, no lesions              Cervix: {CHL AMB PHY EX CERVIX NORM DEFAULT:[phone]::"no lesions"}               Bimanual Exam:  Uterus:  {CHL AMB PHY EX UTERUS NORM DEFAULT:[phone]::"normal size, contour, position, consistency, mobility, non-tender"}              Adnexa: {CHL AMB PHY EX ADNEXA NO MASS DEFAULT:[phone]::"no mass, fullness, tenderness"}               Rectovaginal: Confirms               Anus:  normal sphincter tone, no lesions  *** chaperoned for the exam.  A:  Well Woman with normal exam  P:

## 2022-01-29 NOTE — Progress Notes (Signed)
51 y.o. G0P0000 Divorced White or Caucasian Not Hispanic or Latino female here for annual exam.  On OCP's, no cycles. Not sexually active.     No LMP recorded. (Menstrual status: Oral contraceptives).          Sexually active: No.  The current method of family planning is none.    Exercising: No.   Smoker:  no  Health Maintenance: Pap:  12/17/2017 WNL NEG HPV, 10-25-14 WNL NEG HR HPV   History of abnormal Pap:  no MMG:  02/19/21 density c Bi-rads 1 neg  BMD:   n/a Colonoscopy: 1/20, negative (done for diarrhea which resolved). 1/20 endoscopy with 2 small ulcers (no treatment) TDaP:  11/22/14  Gardasil: n/a   reports that she has never smoked. She has never used smokeless tobacco. She reports current alcohol use of about 1.0 - 2.0 standard drink of alcohol per week. She reports that she does not use drugs. 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   Past Medical History:  Diagnosis Date   Anxiety    Diarrhea    2019 x 3 months; negative eval by integrative specialists and GI, nl EGD and colonoscopy   Vitamin B12 deficiency    Vitamin D deficiency     Past Surgical History:  Procedure Laterality Date   WISDOM TOOTH EXTRACTION      Current Outpatient Medications  Medication Sig Dispense Refill   Norethindrone-Ethinyl Estradiol-Fe Biphas (LO LOESTRIN FE) 1 MG-10 MCG / 10 MCG tablet Take 1 tablet by mouth daily. 84 tablet 3   Norethindrone-Ethinyl Estradiol-Fe Biphas (LO LOESTRIN FE) 1 MG-10 MCG / 10 MCG tablet Take 1 tablet by mouth daily. 28 tablet 0   terbinafine (LAMISIL) 250 MG tablet Take 1 tablet (250 mg total) by mouth daily. 90 tablet 0   triamcinolone cream (KENALOG) 0.1 % Apply 1 application. topically 2 (two) times daily. For 2 weeks, then as needed 45 g 1   No current facility-administered medications for this visit.    Family History  Problem Relation Age of Onset   Hyperlipidemia Mother    Hypertension Mother     Thyroid disease Mother    Breast cancer Paternal Aunt 87   Breast cancer Maternal Grandmother 17   Cancer Maternal Grandfather        lung, smoker and coal miner    Review of Systems  All other systems reviewed and are negative.   Exam:   BP 126/74   Pulse 61   Ht 5\' 5"  (1.651 m)   Wt 141 lb (64 kg)   SpO2 99%   BMI 23.46 kg/m   Weight change: @WEIGHTCHANGE @ Height:   Height: 5\' 5"  (165.1 cm)  Ht Readings from Last 3 Encounters:  02/01/22 5\' 5"  (1.651 m)  11/06/21 5\' 1"  (1.549 m)  10/23/21 5\' 1"  (1.549 m)    General appearance: alert, cooperative and appears stated age Head: Normocephalic, without obvious abnormality, atraumatic Neck: no adenopathy, supple, symmetrical, trachea midline and thyroid normal to inspection and palpation Lungs: clear to auscultation bilaterally Cardiovascular: regular rate and rhythm Breasts: normal appearance, no masses or tenderness Abdomen: soft, non-tender; non distended,  no masses,  no organomegaly Extremities: extremities normal, atraumatic, no cyanosis or edema Skin: Skin color, texture, turgor normal. No rashes or lesions Lymph nodes: Cervical, supraclavicular, and axillary nodes normal. No abnormal inguinal nodes palpated Neurologic: Grossly normal   Pelvic: External genitalia:  no lesions  Urethra:  normal appearing urethra with no masses, tenderness or lesions              Bartholins and Skenes: normal                 Vagina: normal appearing vagina with normal color and discharge, no lesions              Cervix: no lesions               Bimanual Exam:  Uterus:  normal size, contour, position, consistency, mobility, non-tender              Adnexa: no mass, fullness, tenderness               Rectovaginal: Confirms               Anus:  normal sphincter tone, no lesions  Littie Deeds, CMA chaperoned for the exam.   1. Well woman exam Discussed breast self exam Discussed calcium and vit D intake Mammogram due she  will schedule Colonoscopy UTD Labs UTD  2. Encounter for surveillance of contraceptive pills Doing well - Norethindrone-Ethinyl Estradiol-Fe Biphas (LO LOESTRIN FE) 1 MG-10 MCG / 10 MCG tablet; Take 1 tablet by mouth daily.  Dispense: 84 tablet; Refill: 3

## 2022-01-30 ENCOUNTER — Ambulatory Visit: Payer: Commercial Managed Care - PPO | Admitting: Obstetrics and Gynecology

## 2022-02-01 ENCOUNTER — Ambulatory Visit (INDEPENDENT_AMBULATORY_CARE_PROVIDER_SITE_OTHER): Payer: PRIVATE HEALTH INSURANCE | Admitting: Obstetrics and Gynecology

## 2022-02-01 ENCOUNTER — Encounter: Payer: Self-pay | Admitting: Obstetrics and Gynecology

## 2022-02-01 VITALS — BP 126/74 | HR 61 | Ht 65.0 in | Wt 141.0 lb

## 2022-02-01 DIAGNOSIS — Z01419 Encounter for gynecological examination (general) (routine) without abnormal findings: Secondary | ICD-10-CM | POA: Diagnosis not present

## 2022-02-01 DIAGNOSIS — Z3041 Encounter for surveillance of contraceptive pills: Secondary | ICD-10-CM

## 2022-02-01 MED ORDER — LO LOESTRIN FE 1 MG-10 MCG / 10 MCG PO TABS: 1.0000 | ORAL_TABLET | Freq: Every day | ORAL | 3 refills | Status: DC

## 2022-02-01 NOTE — Patient Instructions (Signed)

## 2022-03-22 ENCOUNTER — Other Ambulatory Visit: Payer: Self-pay | Admitting: Podiatry

## 2022-05-14 ENCOUNTER — Encounter: Payer: Self-pay | Admitting: Podiatry

## 2022-05-14 ENCOUNTER — Ambulatory Visit (INDEPENDENT_AMBULATORY_CARE_PROVIDER_SITE_OTHER): Payer: PRIVATE HEALTH INSURANCE | Admitting: Podiatry

## 2022-05-14 DIAGNOSIS — L603 Nail dystrophy: Secondary | ICD-10-CM

## 2022-05-14 MED ORDER — TERBINAFINE HCL 250 MG PO TABS: 250.0000 mg | ORAL_TABLET | Freq: Every day | ORAL | 0 refills | Status: DC

## 2022-05-14 NOTE — Progress Notes (Signed)
She presents today for follow-up of her nail fungus and her Lamisil therapy.  She is completed 120 days of Lamisil therapy with no side effects.  She states that they are looking so much better she is very happy with her improvement.  Objective: Vital signs are stable she is alert and oriented x 3.  Toenails are about 75 to 80% grown out at this point there is still some slight discoloration distally with some thickening distally particularly the hallux bilateral.  Assessment: Well-healing onychomycosis with Lamisil.  Plan: We are going to start her first every other day dose Lamisil 1 p.o. q. OD #30 I will follow-up with her in 3 months.  She will call with any questions or concerns or changes in her medication.

## 2022-07-13 ENCOUNTER — Other Ambulatory Visit: Payer: Self-pay | Admitting: Podiatry

## 2022-08-13 ENCOUNTER — Ambulatory Visit: Payer: PRIVATE HEALTH INSURANCE | Admitting: Podiatry

## 2022-08-20 ENCOUNTER — Encounter: Payer: Self-pay | Admitting: Family Medicine

## 2022-08-20 ENCOUNTER — Ambulatory Visit (INDEPENDENT_AMBULATORY_CARE_PROVIDER_SITE_OTHER): Payer: PRIVATE HEALTH INSURANCE | Admitting: Family Medicine

## 2022-08-20 VITALS — BP 130/70 | HR 88 | Temp 98.2°F | Ht 65.0 in | Wt 142.6 lb

## 2022-08-20 DIAGNOSIS — L219 Seborrheic dermatitis, unspecified: Secondary | ICD-10-CM

## 2022-08-20 DIAGNOSIS — F411 Generalized anxiety disorder: Secondary | ICD-10-CM | POA: Diagnosis not present

## 2022-08-20 DIAGNOSIS — Z0000 Encounter for general adult medical examination without abnormal findings: Secondary | ICD-10-CM

## 2022-08-20 DIAGNOSIS — Z23 Encounter for immunization: Secondary | ICD-10-CM | POA: Diagnosis not present

## 2022-08-20 DIAGNOSIS — N951 Menopausal and female climacteric states: Secondary | ICD-10-CM
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
## 2022-08-20 NOTE — Patient Instructions (Signed)
Please return in 12 months for your annual complete physical; please come fasting.   I will release your lab results to you on your MyChart account with further instructions. You may see the results before I do, but when I review them I will send you a message with my report or have my assistant call you if things need to be discussed. Please reply to my message with any questions. Thank you!   Today you were given your 1st of 2 shingrix vaccination.    If you have any questions or concerns, please don't hesitate to send me a message via MyChart or call the office at [phone]. Thank you for visiting with [place] today! It's our pleasure caring for you.  See GYN this year for your pap smear and hormone discussion.

## 2022-08-20 NOTE — Addendum Note (Signed)
Addended by: Trudie Reed A on: 08/20/2022 09:59 AM   Modules accepted: Orders

## 2022-09-02 ENCOUNTER — Other Ambulatory Visit: Payer: PRIVATE HEALTH INSURANCE

## 2022-09-12 ENCOUNTER — Other Ambulatory Visit: Payer: PRIVATE HEALTH INSURANCE

## 2022-09-12 DIAGNOSIS — Z0000 Encounter for general adult medical examination without abnormal findings: Secondary | ICD-10-CM

## 2022-09-12 DIAGNOSIS — R5383 Other fatigue: Secondary | ICD-10-CM | POA: Diagnosis not present

## 2022-09-12 LAB — COMPREHENSIVE METABOLIC PANEL
ALT: 14 U/L (ref 0–35)
AST: 12 U/L (ref 0–37)
Albumin: 4.4 g/dL (ref 3.5–5.2)
Alkaline Phosphatase: 73 U/L (ref 39–117)
BUN: 10 mg/dL (ref 6–23)
CO2: 28 mEq/L (ref 19–32)
Calcium: 9.5 mg/dL (ref 8.4–10.5)
Chloride: 103 mEq/L (ref 96–112)
Creatinine, Ser: 0.73 mg/dL (ref 0.40–1.20)
GFR: 95.9 mL/min (ref >60.00)
Glucose, Bld: 98 mg/dL (ref 70–99)
Potassium: 4.2 mEq/L (ref 3.5–5.1)
Sodium: 139 mEq/L (ref 135–145)
Total Bilirubin: 0.5 mg/dL (ref 0.2–1.2)
Total Protein: 7.3 g/dL (ref 6.0–8.3)

## 2022-09-12 LAB — CBC WITH DIFFERENTIAL/PLATELET
Basophils Absolute: 0 10*3/uL (ref 0.0–0.1)
Basophils Relative: 0.4 % (ref 0.0–3.0)
Eosinophils Absolute: 0.2 10*3/uL (ref 0.0–0.7)
Eosinophils Relative: 2.1 % (ref 0.0–5.0)
HCT: 41.1 % (ref 36.0–46.0)
Hemoglobin: 13.7 g/dL (ref 12.0–15.0)
Lymphocytes Relative: 27.4 % (ref 12.0–46.0)
Lymphs Abs: 2.1 10*3/uL (ref 0.7–4.0)
MCHC: 33.2 g/dL (ref 30.0–36.0)
MCV: 90.9 fl (ref 78.0–100.0)
Monocytes Absolute: 0.4 10*3/uL (ref 0.1–1.0)
Monocytes Relative: 4.8 % (ref 3.0–12.0)
Neutro Abs: 5 10*3/uL (ref 1.4–7.7)
Neutrophils Relative %: 65.3 % (ref 43.0–77.0)
Platelets: 307 10*3/uL (ref 150.0–400.0)
RBC: 4.52 Mil/uL (ref 3.87–5.11)
RDW: 13.3 % (ref 11.5–15.5)
WBC: 7.6 10*3/uL (ref 4.0–10.5)

## 2022-09-12 LAB — LIPID PANEL
Cholesterol: 199 mg/dL (ref 0–200)
HDL: 54.4 mg/dL (ref >39.00)
LDL Cholesterol: 118 mg/dL — ABNORMAL HIGH (ref 0–99)
NonHDL: 145
Total CHOL/HDL Ratio: 4
Triglycerides: 133 mg/dL (ref 0.0–149.0)
VLDL: 26.6 mg/dL (ref 0.0–40.0)

## 2022-09-12 LAB — TSH: TSH: 2.3 u[IU]/mL (ref 0.35–5.50)

## 2022-09-12 LAB — SEDIMENTATION RATE: Sed Rate: 17 mm/hr (ref 0–30)

## 2022-09-24 NOTE — Progress Notes (Signed)
Labs reviewed.  The 10-year ASCVD risk score (Arnett DK, et al., 2019) is: 1.3%   Values used to calculate the score:     Age: 52 years     Sex: Female     Is Non-Hispanic African American: No     Diabetic: No     Tobacco smoker: No     Systolic Blood Pressure: 130 mmHg     Is BP treated: No     HDL Cholesterol: 54.4 mg/dL     Total Cholesterol: 199 mg/dL

## 2022-10-30 ENCOUNTER — Encounter (INDEPENDENT_AMBULATORY_CARE_PROVIDER_SITE_OTHER): Payer: Self-pay

## 2022-11-28 ENCOUNTER — Ambulatory Visit: Payer: PRIVATE HEALTH INSURANCE

## 2022-11-28 DIAGNOSIS — Z23 Encounter for immunization: Secondary | ICD-10-CM | POA: Diagnosis not present

## 2022-11-28 NOTE — Progress Notes (Signed)
Pt came in on the Nurse schedule to receive the #2 shingles vaccine. Administered in the Rt deltoid without any complaints.

## 2022-12-04 LAB — HM MAMMOGRAPHY

## 2023-01-01 ENCOUNTER — Encounter: Payer: Self-pay | Admitting: Family Medicine

## 2023-01-31 ENCOUNTER — Other Ambulatory Visit (HOSPITAL_BASED_OUTPATIENT_CLINIC_OR_DEPARTMENT_OTHER): Payer: Self-pay

## 2023-01-31 MED ORDER — COMIRNATY 30 MCG/0.3ML IM SUSY
0.3000 mL | PREFILLED_SYRINGE | Freq: Once | INTRAMUSCULAR | 0 refills | Status: AC
  Filled 2023-01-31: qty 0.3, 1d supply, fill #0

## 2023-02-06 ENCOUNTER — Other Ambulatory Visit (HOSPITAL_COMMUNITY)
Admission: RE | Admit: 2023-02-06 | Discharge: 2023-02-06 | Disposition: A | Discharge status: Home / Self Care | Payer: PRIVATE HEALTH INSURANCE | Source: Ambulatory Visit | Attending: Obstetrics and Gynecology | Admitting: Obstetrics and Gynecology

## 2023-02-06 ENCOUNTER — Ambulatory Visit (INDEPENDENT_AMBULATORY_CARE_PROVIDER_SITE_OTHER): Payer: PRIVATE HEALTH INSURANCE | Admitting: Obstetrics and Gynecology

## 2023-02-06 ENCOUNTER — Encounter: Payer: Self-pay | Admitting: Obstetrics and Gynecology

## 2023-02-06 ENCOUNTER — Ambulatory Visit: Payer: PRIVATE HEALTH INSURANCE | Admitting: Obstetrics and Gynecology

## 2023-02-06 ENCOUNTER — Telehealth: Payer: Self-pay | Admitting: *Deleted

## 2023-02-06 VITALS — BP 120/78 | HR 87 | Ht 60.75 in | Wt 141.0 lb

## 2023-02-06 DIAGNOSIS — Z01419 Encounter for gynecological examination (general) (routine) without abnormal findings: Secondary | ICD-10-CM

## 2023-02-06 DIAGNOSIS — Z3041 Encounter for surveillance of contraceptive pills: Secondary | ICD-10-CM

## 2023-02-06 DIAGNOSIS — D219 Benign neoplasm of connective and other soft tissue, unspecified: Secondary | ICD-10-CM | POA: Diagnosis not present

## 2023-02-06 MED ORDER — LO LOESTRIN FE 1 MG-10 MCG / 10 MCG PO TABS: 1.0000 | ORAL_TABLET | Freq: Every day | ORAL | 3 refills | Status: DC

## 2023-02-06 NOTE — Telephone Encounter (Signed)
Call from patient's pharmacy. Lo lo estrin is not covered by patient's insurance and will cost patient oop $730. Sheilah Pigeon, pharmacy tech, states covered alternatives are apri, balziva or afirnelle.  Routing to provider for review and to review on which medication be sent to pharmacy.

## 2023-02-06 NOTE — Progress Notes (Signed)
52 y.o. y.o. female here for annual exam. She denies bleeding on the ocp's.  G0P0000 Divorced White or Caucasian Not Hispanic or Latino female here for annual exam.  On OCP's, no cycles. Not sexually active.   H/o fibroids and heavy painful periods.  Does not want to come off the pills and experience the periods and pain. Does report some hot flashes.  No LMP recorded. (Menstrual status: Oral contraceptives).          Sexually active: No.  The current method of family planning is none.    Exercising: No.   Smoker:  no  Health Maintenance: Pap:  12/17/2017 WNL NEG HPV, 10-25-14 WNL NEG HR HPV   History of abnormal Pap:  no MMG:  02/19/21 density c Bi-rads 1 neg in epic, patient reports this year was completed. Has fmhx of breast cancer BMD:   n/a Colonoscopy: 1/20, negative (done for diarrhea which resolved). 1/20 endoscopy with 2 small ulcers (no treatment) TDaP:  11/22/14  Gardasil: n/a  Blood pressure 120/78, pulse 87, height 5' 0.75" (1.543 m), weight 141 lb (64 kg), SpO2 98%.     Component Value Date/Time   DIAGPAP  12/17/2017 0000    NEGATIVE FOR INTRAEPITHELIAL LESIONS OR MALIGNANCY.   ADEQPAP  12/17/2017 0000    Satisfactory for evaluation  endocervical/transformation zone component PRESENT.    GYN HISTORY:    Component Value Date/Time   DIAGPAP  12/17/2017 0000    NEGATIVE FOR INTRAEPITHELIAL LESIONS OR MALIGNANCY.   ADEQPAP  12/17/2017 0000    Satisfactory for evaluation  endocervical/transformation zone component PRESENT.    OB History  Gravida Para Term Preterm AB Living  0 0 0 0 0 0  SAB IAB Ectopic Multiple Live Births  0 0 0 0 0    Past Medical History:  Diagnosis Date   Anxiety    Diarrhea    2019 x 3 months; negative eval by integrative specialists and GI, nl EGD and colonoscopy   Vitamin B12 deficiency    Vitamin D deficiency     Past Surgical History:  Procedure Laterality Date   WISDOM TOOTH EXTRACTION      Current Outpatient  Medications on File Prior to Visit  Medication Sig Dispense Refill   Norethindrone-Ethinyl Estradiol-Fe Biphas (LO LOESTRIN FE) 1 MG-10 MCG / 10 MCG tablet Take 1 tablet by mouth daily. 84 tablet 3   triamcinolone cream (KENALOG) 0.1 % Apply 1 application. topically 2 (two) times daily. For 2 weeks, then as needed 45 g 1   No current facility-administered medications on file prior to visit.    Social History   Socioeconomic History   Marital status: Divorced    Spouse name: Not on file   Number of children: 0   Years of education: Not on file   Highest education level: Not on file  Occupational History   Occupation: Pharmacist    Employer: [employer]  Tobacco Use   Smoking status: Never   Smokeless tobacco: Never  Vaping Use   Vaping status: Never Used  Substance and Sexual Activity   Alcohol use: Yes    Alcohol/week: 1.0 - 2.0 standard drink of alcohol    Types: 1 - 2 Standard drinks or equivalent per week    Comment: socially   Drug use: No   Sexual activity: Not Currently    Partners: Male    Birth control/protection: OCP  Other Topics Concern   Not on file  Social History Narrative  Moved here from [place]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  She is now working with [employer] and currently doing some night shift.   Social Determinants of Health   Financial Resource Strain: Not on file  Food Insecurity: Not on file  Transportation Needs: Not on file  Physical Activity: Not on file  Stress: Not on file  Social Connections: Unknown (08/13/2021)   Received from [facility]   Social Network    Social Network: Not on file  Intimate Partner Violence: Unknown (07/05/2021)   Received from [facility], [facility]   HITS    Physically Hurt: Not on file    Insult or Talk Down To: Not on file    Threaten Physical Harm: Not on file    Scream or Curse: Not on file    Family History  Problem Relation Age of Onset   Hyperlipidemia Mother     Hypertension Mother    Thyroid disease Mother    Breast cancer Paternal Aunt 33   Breast cancer Maternal Grandmother 68   Cancer Maternal Grandfather        lung, smoker and coal miner     Allergies  Allergen Reactions   Gluten Meal     Other reaction(s): Other Migraines      Patient's last menstrual period was No LMP recorded. (Menstrual status: Oral contraceptives)..            Review of Systems Alls systems reviewed and are negative.     Physical Exam Constitutional:      Appearance: Normal appearance.  Genitourinary:     Vulva and urethral meatus normal.     No lesions in the vagina.     Genitourinary Comments: Fibroids palpated     Right Labia: No rash, lesions or skin changes.    Left Labia: No lesions, skin changes or rash.    No vaginal discharge or tenderness.     No vaginal prolapse present.    No vaginal atrophy present.     Right Adnexa: not tender, not palpable and no mass present.    Left Adnexa: not tender, not palpable and no mass present.    No cervical motion tenderness or discharge.     Uterus is enlarged and irregular.     Uterus is not tender.     Uterus is anteverted.  Breasts:    Right: Normal.     Left: Normal.  HENT:     Head: Normocephalic.  Neck:     Thyroid: No thyroid mass, thyromegaly or thyroid tenderness.  Cardiovascular:     Rate and Rhythm: Normal rate and regular rhythm.     Heart sounds: Normal heart sounds, S1 normal and S2 normal.  Pulmonary:     Effort: Pulmonary effort is normal.     Breath sounds: Normal breath sounds and air entry.  Abdominal:     General: There is no distension.     Palpations: Abdomen is soft. There is no mass.     Tenderness: There is no abdominal tenderness. There is no guarding or rebound.  Musculoskeletal:        General: Normal range of motion.     Cervical back: Full passive range of motion without pain, normal range of motion and neck supple. No tenderness.     Right lower leg: No  edema.     Left lower leg: No edema.  Neurological:     Mental Status: She is alert.  Skin:  General: Skin is warm.  Psychiatric:        Mood and Affect: Mood normal.        Behavior: Behavior normal.        Thought Content: Thought content normal.  Vitals and nursing note reviewed. Exam conducted with a chaperone present.       A:         Well Woman GYN exam                             P:        Pap smear collected today Encouraged annual mammogram screening Colon cancer screening up-to-date DXA not indicated Labs and immunizations to do with PMD Discussed breast self exams Encouraged healthy lifestyle practices  OCP's refilled.  Discussed in future of starting HRT instead with less risks in menopause, but she would like to stay on her current ocp. Referral for TV US to evaluate fibroids No follow-ups on file.  Earley Favor

## 2023-02-10 LAB — CYTOLOGY - PAP
Comment: NEGATIVE
Diagnosis: NEGATIVE
High risk HPV: NEGATIVE

## 2023-02-11 NOTE — Telephone Encounter (Signed)
Prior authorization initiated for lo loestrin. Message left letting patient know. Key: [id number]

## 2023-02-12 NOTE — Telephone Encounter (Signed)
Patient notified that Prior Authorization was approved.

## 2023-02-20 ENCOUNTER — Ambulatory Visit (HOSPITAL_BASED_OUTPATIENT_CLINIC_OR_DEPARTMENT_OTHER): Payer: PRIVATE HEALTH INSURANCE

## 2023-04-18 ENCOUNTER — Ambulatory Visit (HOSPITAL_BASED_OUTPATIENT_CLINIC_OR_DEPARTMENT_OTHER): Payer: PRIVATE HEALTH INSURANCE

## 2023-08-21 ENCOUNTER — Encounter: Payer: Self-pay | Admitting: Family Medicine

## 2023-08-21 ENCOUNTER — Ambulatory Visit (INDEPENDENT_AMBULATORY_CARE_PROVIDER_SITE_OTHER): Payer: PRIVATE HEALTH INSURANCE | Admitting: Family Medicine

## 2023-08-21 VITALS — BP 134/82 | HR 74 | Temp 98.2°F | Ht 60.75 in | Wt 146.0 lb

## 2023-08-21 DIAGNOSIS — Z0000 Encounter for general adult medical examination without abnormal findings: Secondary | ICD-10-CM

## 2023-08-21 DIAGNOSIS — L219 Seborrheic dermatitis, unspecified: Secondary | ICD-10-CM

## 2023-08-21 DIAGNOSIS — F411 Generalized anxiety disorder: Secondary | ICD-10-CM

## 2023-08-21 DIAGNOSIS — K9041 Non-celiac gluten sensitivity: Secondary | ICD-10-CM

## 2023-08-21 LAB — CBC WITH DIFFERENTIAL/PLATELET
Basophils Absolute: 0 10*3/uL (ref 0.0–0.1)
Basophils Relative: 0.5 % (ref 0.0–3.0)
Eosinophils Absolute: 0.2 10*3/uL (ref 0.0–0.7)
Eosinophils Relative: 2.6 % (ref 0.0–5.0)
HCT: 41.1 % (ref 36.0–46.0)
Hemoglobin: 13.8 g/dL (ref 12.0–15.0)
Lymphocytes Relative: 26.6 % (ref 12.0–46.0)
Lymphs Abs: 1.9 10*3/uL (ref 0.7–4.0)
MCHC: 33.6 g/dL (ref 30.0–36.0)
MCV: 88.6 fl (ref 78.0–100.0)
Monocytes Absolute: 0.4 10*3/uL (ref 0.1–1.0)
Monocytes Relative: 5.7 % (ref 3.0–12.0)
Neutro Abs: 4.7 10*3/uL (ref 1.4–7.7)
Neutrophils Relative %: 64.6 % (ref 43.0–77.0)
Platelets: 261 10*3/uL (ref 150.0–400.0)
RBC: 4.65 Mil/uL (ref 3.87–5.11)
RDW: 13.1 % (ref 11.5–15.5)
WBC: 7.3 10*3/uL (ref 4.0–10.5)

## 2023-08-21 LAB — LIPID PANEL
Cholesterol: 233 mg/dL — ABNORMAL HIGH (ref 0–200)
HDL: 67.9 mg/dL (ref >39.00)
LDL Cholesterol: 142 mg/dL — ABNORMAL HIGH (ref 0–99)
NonHDL: 165.4
Total CHOL/HDL Ratio: 3
Triglycerides: 116 mg/dL (ref 0.0–149.0)
VLDL: 23.2 mg/dL (ref 0.0–40.0)

## 2023-08-21 LAB — COMPREHENSIVE METABOLIC PANEL WITH GFR
ALT: 34 U/L (ref 0–35)
AST: 21 U/L (ref 0–37)
Albumin: 4.6 g/dL (ref 3.5–5.2)
Alkaline Phosphatase: 95 U/L (ref 39–117)
BUN: 11 mg/dL (ref 6–23)
CO2: 31 meq/L (ref 19–32)
Calcium: 9.5 mg/dL (ref 8.4–10.5)
Chloride: 102 meq/L (ref 96–112)
Creatinine, Ser: 0.66 mg/dL (ref 0.40–1.20)
GFR: 101.63 mL/min (ref >60.00)
Glucose, Bld: 100 mg/dL — ABNORMAL HIGH (ref 70–99)
Potassium: 4.6 meq/L (ref 3.5–5.1)
Sodium: 139 meq/L (ref 135–145)
Total Bilirubin: 0.6 mg/dL (ref 0.2–1.2)
Total Protein: 7.2 g/dL (ref 6.0–8.3)

## 2023-08-21 LAB — TSH: TSH: 2.4 u[IU]/mL (ref 0.35–5.50)

## 2023-08-21 MED ORDER — TRIAMCINOLONE ACETONIDE 0.1 % EX CREA: 1.0000 | TOPICAL_CREAM | Freq: Two times a day (BID) | CUTANEOUS | 1 refills | Status: AC
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
## 2023-08-21 NOTE — Patient Instructions (Signed)
 Please return in 12 months for your annual complete physical; please come fasting.   I will release your lab results to you on your MyChart account with further instructions. You may see the results before I do, but when I review them I will send you a message with my report or have my assistant call you if things need to be discussed. Please reply to my message with any questions. Thank you!   If you have any questions or concerns, please don't hesitate to send me a message via MyChart or call the office at [phone]. Thank you for visiting with us  today! It's our pleasure caring for you.    VISIT SUMMARY: Today, we discussed your concerns about hormonal changes and perimenopausal symptoms, including hot flashes and migraines. We also reviewed your history of fibroids and recent blood pressure readings.  YOUR PLAN: -PERIMENOPAUSAL SYMPTOMS: Perimenopause is the transition period before menopause when hormonal changes can cause symptoms like hot flashes and migraines. We will discuss hormone replacement therapy options to help manage these symptoms.  -UTERINE FIBROIDS: Uterine fibroids are non-cancerous growths in the uterus that can cause an enlarged uterus. Although your recent ultrasound did not confirm an enlarged uterus, we will perform another gynecological exam to confirm the findings.  -ELEVATED BLOOD PRESSURE: Your blood pressure was previously elevated but is now normal. We will continue to monitor it as part of your routine care.  INSTRUCTIONS: Please schedule a follow-up appointment to discuss hormone replacement therapy options and to have another gynecological exam. Continue monitoring your blood pressure at home and report any significant changes.                      Contains text generated by Abridge.                                 Contains text generated by Abridge.

## 2023-08-22 ENCOUNTER — Ambulatory Visit: Payer: Self-pay | Admitting: Family Medicine

## 2023-08-22 NOTE — Progress Notes (Signed)
 Labs reviewed.  The 10-year ASCVD risk score (Arnett DK, et al., 2019) is: 1.4%   Values used to calculate the score:     Age: 53 years     Sex: Female     Is Non-Hispanic African American: No     Diabetic: No     Tobacco smoker: No     Systolic Blood Pressure: 134 mmHg     Is BP treated: No     HDL Cholesterol: 67.9 mg/dL     Total Cholesterol: 233 mg/dL

## 2023-08-29 IMAGING — MG MM DIGITAL SCREENING BILAT W/ TOMO AND CAD
8 series · 9 of 24 positions shown · non-contrast
Comparison: Previous exam(s).

CLINICAL DATA: Screening.

EXAM:
DIGITAL SCREENING BILATERAL MAMMOGRAM WITH TOMOSYNTHESIS AND CAD
TECHNIQUE: Bilateral screening digital craniocaudal and mediolateral oblique
mammograms were obtained. Bilateral screening digital breast
tomosynthesis was performed. The images were evaluated with
computer-aided detection.

[R CC synth-2D]
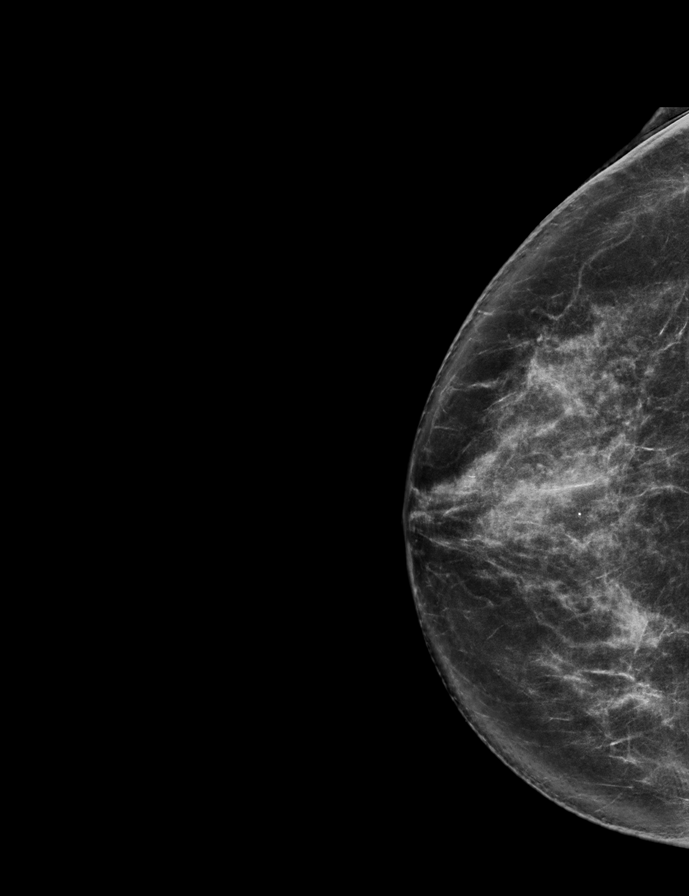

[L MLO synth-2D]
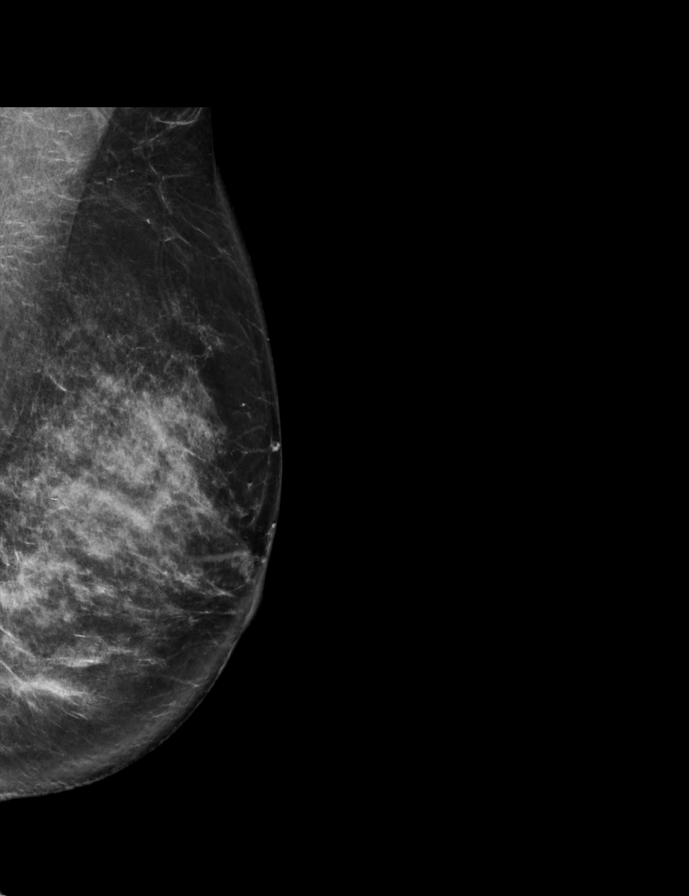

[R MLO synth-2D]
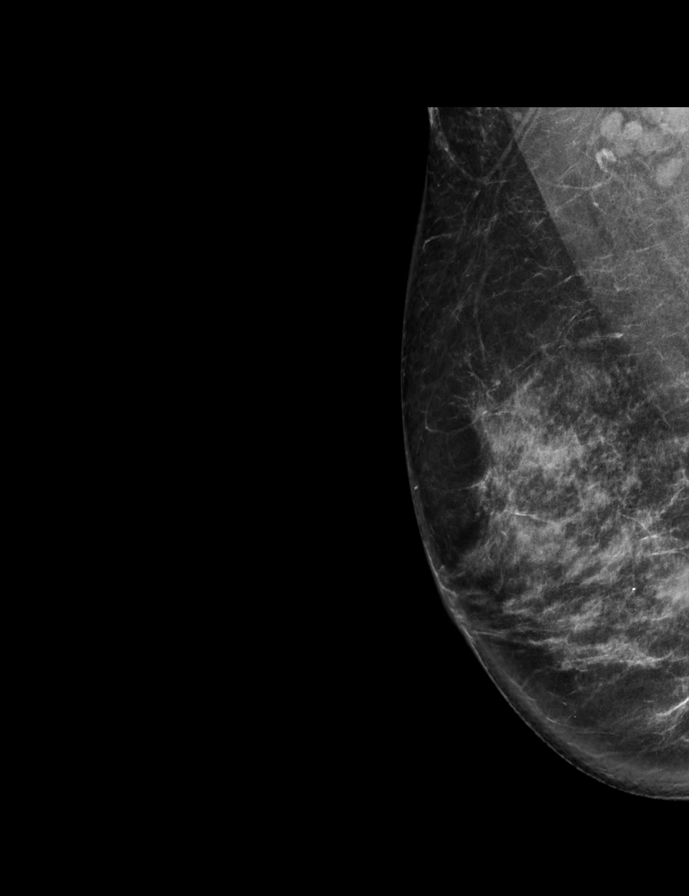

[L CC synth-2D]
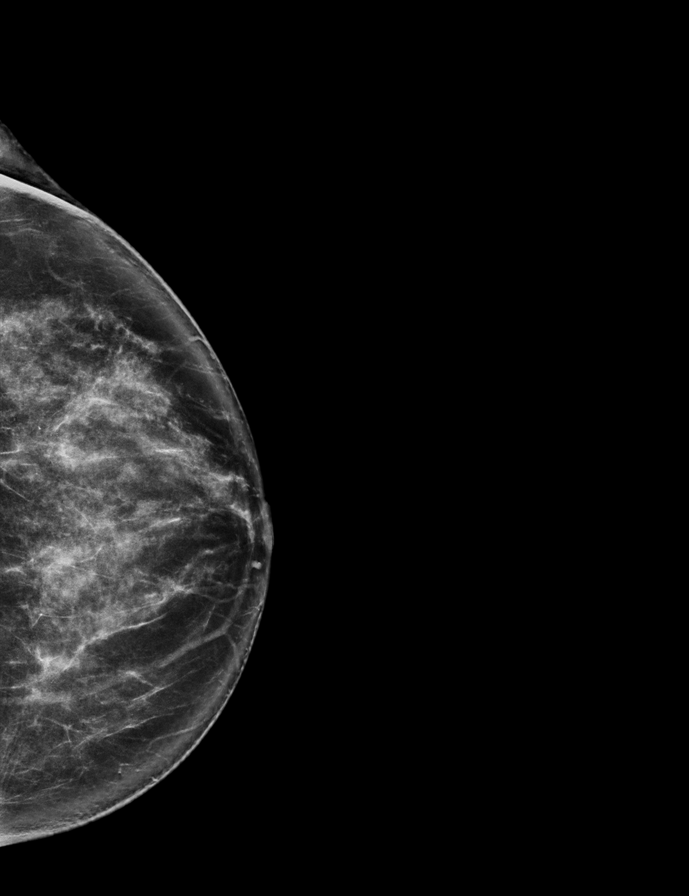

[R CC tomo · 2 of 75 frames shown]
[frame 25/75]
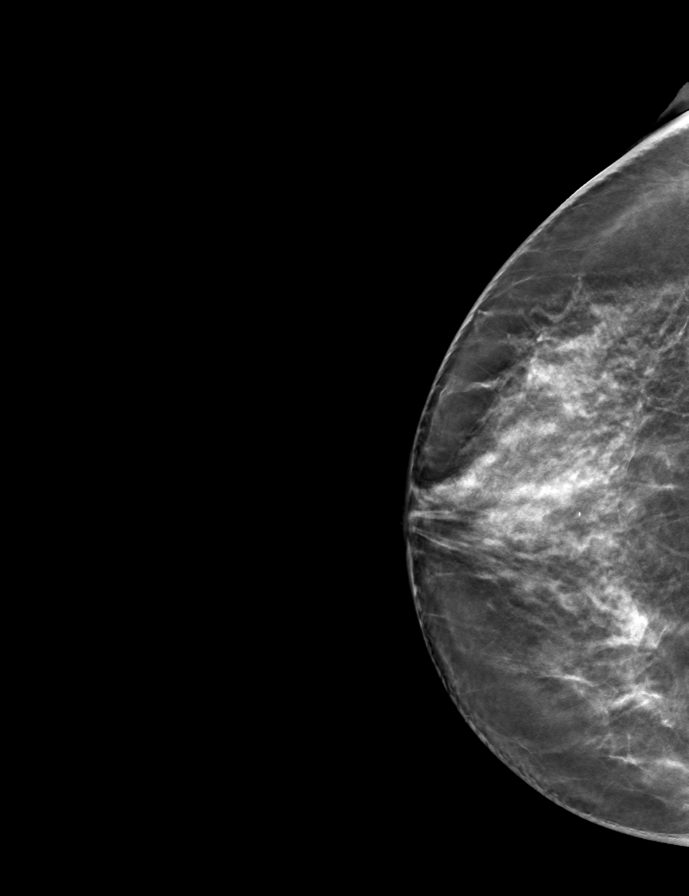
[frame 38/75]
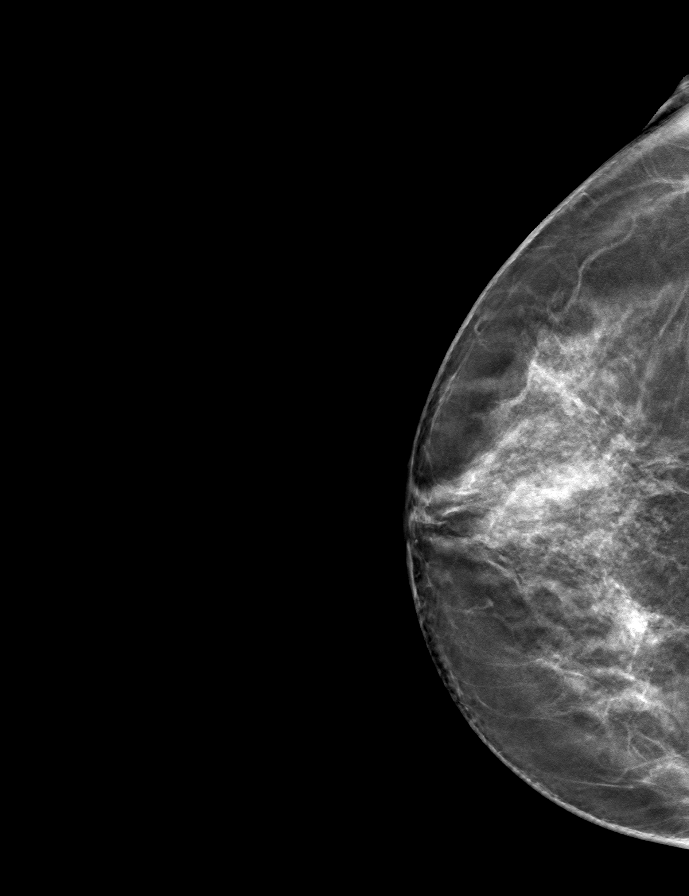

[L MLO tomo · tomo slice 39/76.0]
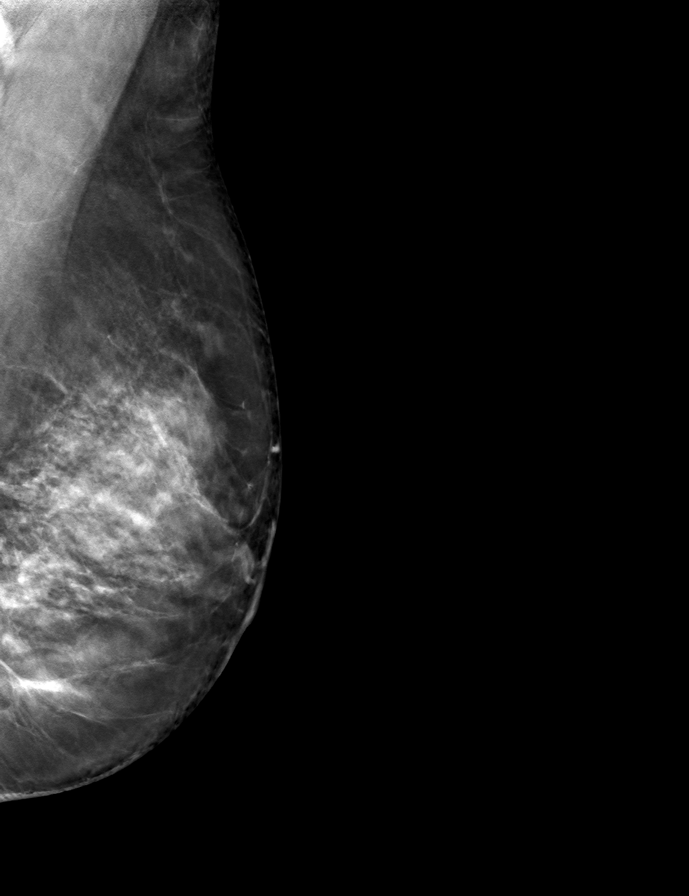

[L CC tomo · tomo slice 39/76.0]
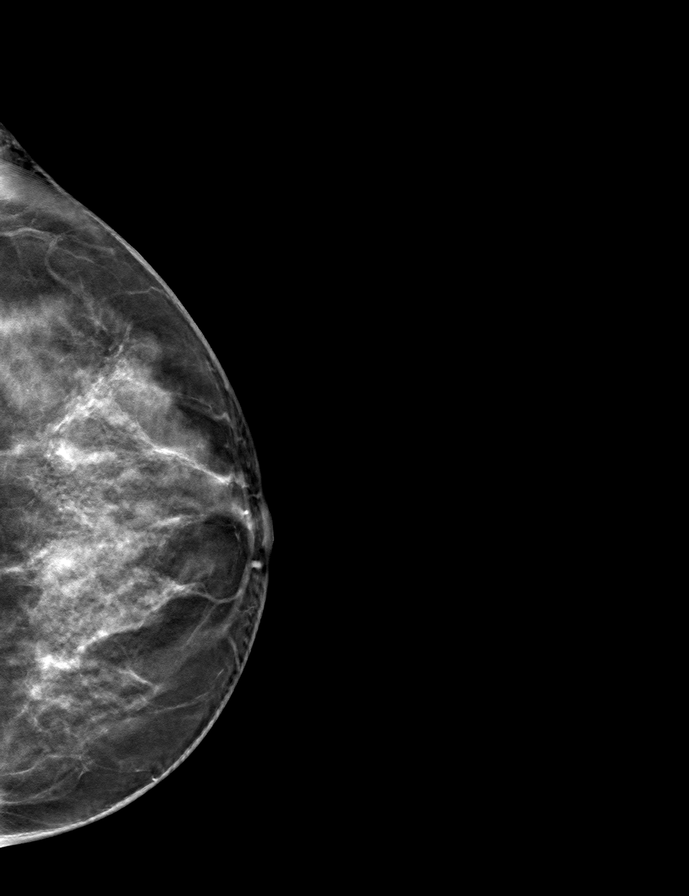

[R MLO tomo · tomo slice 38/75.0]
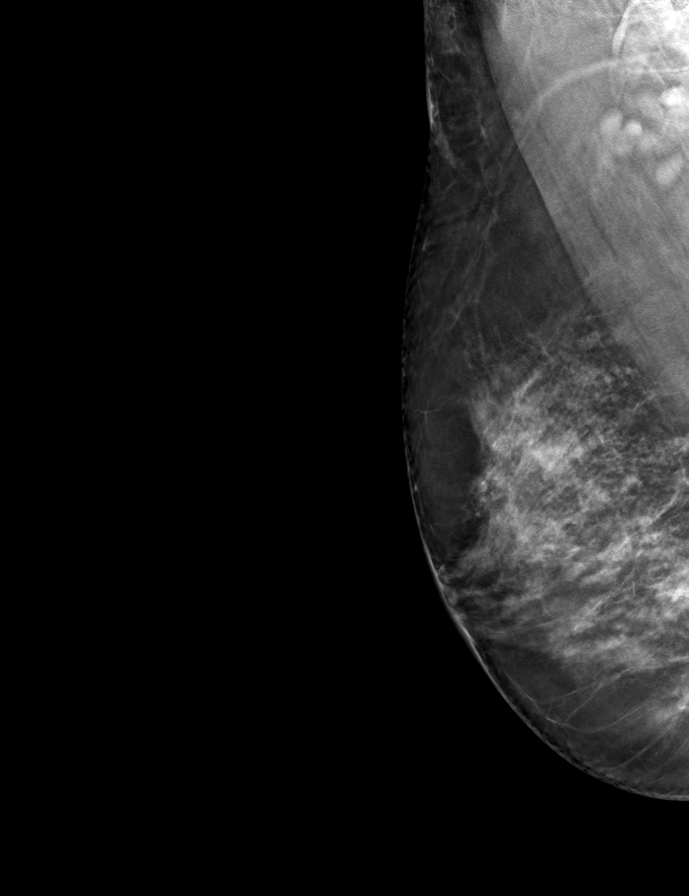

[9 of 24 positions shown; findings below may reference images not displayed]

ACR Breast Density Category c: The breast tissue is heterogeneously
dense, which may obscure small masses.
FINDINGS: There are no findings suspicious for malignancy.
IMPRESSION: No mammographic evidence of malignancy. A result letter of this
screening mammogram will be mailed directly to the patient.

RECOMMENDATION:
Screening mammogram in one year. (Code:Q3-W-BC3)

BI-RADS CATEGORY  1: Negative.

## 2023-11-27 ENCOUNTER — Encounter: Payer: Self-pay | Admitting: Family Medicine

## 2023-11-27 ENCOUNTER — Ambulatory Visit (INDEPENDENT_AMBULATORY_CARE_PROVIDER_SITE_OTHER): Payer: PRIVATE HEALTH INSURANCE | Admitting: Family Medicine

## 2023-11-27 VITALS — BP 131/80 | HR 80 | Temp 98.2°F | Ht 60.75 in | Wt 146.8 lb

## 2023-11-27 DIAGNOSIS — R030 Elevated blood-pressure reading, without diagnosis of hypertension: Secondary | ICD-10-CM

## 2023-11-27 DIAGNOSIS — N921 Excessive and frequent menstruation with irregular cycle: Secondary | ICD-10-CM

## 2023-11-27 DIAGNOSIS — D219 Benign neoplasm of connective and other soft tissue, unspecified: Secondary | ICD-10-CM | POA: Diagnosis not present

## 2023-11-27 MED ORDER — NORETHIN-ETH ESTRAD-FE BIPHAS 1 MG-10 MCG / 10 MCG PO TABS: 1.0000 | ORAL_TABLET | Freq: Every day | ORAL | 11 refills | Status: AC
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
## 2023-12-18 ENCOUNTER — Ambulatory Visit: Payer: Self-pay

## 2023-12-18 ENCOUNTER — Ambulatory Visit: Payer: PRIVATE HEALTH INSURANCE | Admitting: Family Medicine

## 2023-12-18 ENCOUNTER — Encounter: Payer: Self-pay | Admitting: Family Medicine

## 2023-12-18 VITALS — BP 152/76 | HR 80 | Temp 98.7°F | Resp 16 | Ht 60.75 in | Wt 145.0 lb

## 2023-12-18 DIAGNOSIS — R42 Dizziness and giddiness: Secondary | ICD-10-CM | POA: Diagnosis not present

## 2023-12-18 DIAGNOSIS — H81399 Other peripheral vertigo, unspecified ear: Secondary | ICD-10-CM

## 2023-12-18 DIAGNOSIS — R5381 Other malaise: Secondary | ICD-10-CM

## 2023-12-18 DIAGNOSIS — R110 Nausea: Secondary | ICD-10-CM | POA: Diagnosis not present
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
## 2023-12-18 NOTE — Telephone Encounter (Signed)
 FYI Only or Action Required?: Action required by provider: request for appointment.  Patient was last seen in primary care on 11/27/2023 by Jodie Lavern CROME, MD.  Called Nurse Triage reporting Dizziness.  Symptoms began yesterday.  Interventions attempted: Rest, hydration, or home remedies.  Symptoms are: gradually improving.  Triage Disposition: See Physician Within 24 Hours  Patient/caregiver understands and will follow disposition?:    Copied from CRM [id number]. Topic: Clinical - Red Word Triage >> Dec 18, 2023 10:54 AM Terri G wrote: Red Word that prompted transfer to Nurse Triage: Patient is experiencing extreme dizziness and nausea Reason for Disposition  [1] MODERATE dizziness (e.g., interferes with normal activities) AND [2] has NOT been evaluated by doctor (or NP/PA) for this  (Exception: Dizziness caused by heat exposure, sudden standing, or poor fluid intake.)  Answer Assessment - Initial Assessment Questions 1. DESCRIPTION: Describe your dizziness.     dizzy 2. LIGHTHEADED: Do you feel lightheaded? (e.g., somewhat faint, woozy, weak upon standing)     woozy 3. VERTIGO: Do you feel like either you or the room is spinning or tilting? (i.e., vertigo)     yes 4. SEVERITY: How bad is it?  Do you feel like you are going to faint? Can you stand and walk?     yes 5. ONSET:  When did the dizziness begin?     yesterday 6. AGGRAVATING FACTORS: Does anything make it worse? (e.g., standing, change in head position)     standing 7. HEART RATE: Can you tell me your heart rate? How many beats in 15 seconds?  (Note: Not all patients can do this.)       no 8. CAUSE: What do you think is causing the dizziness? (e.g., decreased fluids or food, diarrhea, emotional distress, heat exposure, new medicine, sudden standing, vomiting; unknown)     unsure 9. RECURRENT SYMPTOM: Have you had dizziness before? If Yes, ask: When was the last time? What happened that  time?     no 10. OTHER SYMPTOMS: Do you have any other symptoms? (e.g., fever, chest pain, vomiting, diarrhea, bleeding)       no 11. PREGNANCY: Is there any chance you are pregnant? When was your last menstrual period?       no  Protocols used: Dizziness - Lightheadedness-A-AH

## 2023-12-18 NOTE — Telephone Encounter (Signed)
 Appt today

## 2023-12-18 NOTE — Patient Instructions (Signed)
 Sorry to hear about the episode yesterday.  Based on how the symptoms presented and the recurrence of symptoms with head movement, I do suspect peripheral vertigo is most likely cause.  I will check your blood count to rule out anemia as well as some electrolytes today but I do not think this will be the issue.  It may be worth trying the meclizine temporarily, and plain foods, fluids for the next day or 2.  If you are not continuing to improve through the weekend I do recommend follow-up with your primary care provider next week.  If any acute worsening of symptoms or new symptoms, ER evaluation this weekend would be recommended but that is unlikely.  Please let me know if you have questions.  Once I have the results I will send you a message through MyChart.  Take care.   Vertigo Vertigo is the feeling that you or the things around you are moving or spinning when they're not. It's different than feeling dizzy. It can also cause: Loss of balance. Trouble standing or walking. Nausea and vomiting. This feeling can come and go at any time. It can last from a few seconds to minutes or even hours. It may go away on its own or be treated with medicine. What are the types of vertigo? There are two types of vertigo: Peripheral vertigo happens when parts of your inner ear don't work like they should. This is the more common type. Central vertigo happens when your brain and spinal cord don't work like they should. Your health care provider will do tests to find out what kind of vertigo you have. This will help them decide on the right treatment for you. Follow these instructions at home: Eating and drinking Drink enough fluid to keep your pee (urine) pale yellow. Do not drink alcohol. Activity When you get up in the morning, first sit up on the side of the bed. When you feel okay, stand slowly while holding onto something. Move slowly. Avoid sudden body or head movements. Avoid certain positions, as told  by your provider. Use a cane if you have trouble standing or walking. Sit down right away if you feel unsteady. Place items in your home so they're easy for you to reach without bending or leaning over. Return to normal activities when you're told. Ask what things are safe for you to do. General instructions Take your medicines only as told by your provider. Contact a health care provider if: Your medicines don't help or make your vertigo worse. You get new symptoms. You have a fever. You have nausea or vomiting. Your family or friends spot any changes in how you're acting. A part of your body goes numb. You feel tingling and prickling in a part of your body. You get very bad headaches. Get help right away if: You're always dizzy or you faint. You have a stiff neck. You have trouble moving or speaking. Your hands, arms, or legs feel weak. Your hearing or eyesight changes. These symptoms may be an emergency. Call 911 right away. Do not wait to see if the symptoms will go away. Do not drive yourself to the hospital. This information is not intended to replace advice given to you by your health care provider. Make sure you discuss any questions you have with your health care provider. Document Revised: 12/19/2022 Document Reviewed: 06/21/2022 Elsevier Patient Education  2024 [employer]  Dizziness Dizziness is a common problem. It makes you feel unsteady or light-headed.  You may feel like you're about to faint. Dizziness can lead to getting hurt if you stumble or fall. It's more common to feel dizzy if you're an older adult. Many things can cause you to feel dizzy. These include: Medicines. Dehydration. This is when there's not enough water in your body. Illness. Follow these instructions at home: Eating and drinking  Drink enough fluid to keep your pee (urine) pale yellow. This helps keep you from getting dehydrated. Try to drink more clear fluids, such as water. Do not  drink alcohol. Try to limit how much caffeine you take in. Try to limit how much salt, also called sodium, you take in. Activity Try not to make quick movements. Stand up slowly from sitting in a chair. Steady yourself until you feel okay. In the morning, first sit up on the side of the bed. When you feel okay, hold onto something and slowly stand up. Do this until you know that your balance is okay. If you need to stand in one place for a long time, move your legs often. Tighten and relax the muscles in your legs while you're standing. Do not drive or use machines if you feel dizzy. Avoid bending down if you feel dizzy. Place items in your home so you can reach them without leaning over. Lifestyle Do not smoke, vape, or use products with nicotine or tobacco in them. If you need help quitting, talk with your health care provider. Try to lower your stress level. You can do this by using methods like yoga or meditation. Talk with your provider if you need help. General instructions Watch your dizziness for any changes. Take your medicines only as told by your provider. Talk with your provider if you think you're dizzy because of a medicine you're taking. Tell a friend or a family member that you're feeling dizzy. If they spot any changes in your behavior, have them call your provider. Contact a health care provider if: Your dizziness doesn't go away, or you have new symptoms. Your dizziness gets worse. You feel like you may vomit. You have trouble hearing. You have a fever. You have neck pain or a stiff neck. You fall or get hurt. Get help right away if: You vomit each time you eat or drink. You have watery poop and can't eat or drink. You have trouble talking, walking, swallowing, or using your arms, hands, or legs. You feel very weak. You're bleeding. You're not thinking clearly, or you have trouble forming sentences. A friend or family member may spot this. Your vision changes, or  you get a very bad headache. These symptoms may be an emergency. Call 911 right away. Do not wait to see if the symptoms will go away. Do not drive yourself to the hospital. This information is not intended to replace advice given to you by your health care provider. Make sure you discuss any questions you have with your health care provider. Document Revised: 12/19/2022 Document Reviewed: 05/02/2022 Elsevier Patient Education  2024 [employer].

## 2023-12-19 ENCOUNTER — Ambulatory Visit: Payer: Self-pay | Admitting: Family Medicine

## 2023-12-19 ENCOUNTER — Encounter: Payer: Self-pay | Admitting: Family Medicine

## 2023-12-19 LAB — CBC
HCT: 41.7 % (ref 36.0–46.0)
Hemoglobin: 13.8 g/dL (ref 12.0–15.0)
MCHC: 33 g/dL (ref 30.0–36.0)
MCV: 90.3 fl (ref 78.0–100.0)
Platelets: 359 K/uL (ref 150.0–400.0)
RBC: 4.61 Mil/uL (ref 3.87–5.11)
RDW: 13.3 % (ref 11.5–15.5)
WBC: 9.1 K/uL (ref 4.0–10.5)

## 2023-12-19 LAB — BASIC METABOLIC PANEL WITH GFR
BUN: 8 mg/dL (ref 6–23)
CO2: 26 meq/L (ref 19–32)
Calcium: 9.7 mg/dL (ref 8.4–10.5)
Chloride: 104 meq/L (ref 96–112)
Creatinine, Ser: 0.71 mg/dL (ref 0.40–1.20)
GFR: 98.28 mL/min (ref >60.00)
Glucose, Bld: 83 mg/dL (ref 70–99)
Potassium: 4.5 meq/L (ref 3.5–5.1)
Sodium: 138 meq/L (ref 135–145)

## 2023-12-20 ENCOUNTER — Encounter: Payer: Self-pay | Admitting: Family Medicine

## 2024-03-17 ENCOUNTER — Ambulatory Visit: Payer: PRIVATE HEALTH INSURANCE | Admitting: Family Medicine

## 2024-03-17 ENCOUNTER — Encounter: Payer: Self-pay | Admitting: Family Medicine

## 2024-03-17 ENCOUNTER — Inpatient Hospital Stay: Payer: PRIVATE HEALTH INSURANCE | Admitting: Family

## 2024-03-17 VITALS — BP 150/72 | HR 89 | Temp 98.8°F | Ht 60.0 in | Wt 148.2 lb

## 2024-03-17 DIAGNOSIS — I2603 Cement embolism of pulmonary artery with acute cor pulmonale: Secondary | ICD-10-CM

## 2024-03-17 DIAGNOSIS — Z1883 Retained stone or crystalline fragments: Secondary | ICD-10-CM

## 2024-03-17 DIAGNOSIS — N921 Excessive and frequent menstruation with irregular cycle: Secondary | ICD-10-CM

## 2024-03-17 DIAGNOSIS — Z3041 Encounter for surveillance of contraceptive pills: Secondary | ICD-10-CM | POA: Diagnosis not present

## 2024-03-17 DIAGNOSIS — D219 Benign neoplasm of connective and other soft tissue, unspecified: Secondary | ICD-10-CM
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
## 2024-04-08 ENCOUNTER — Encounter: Payer: Self-pay | Admitting: Family Medicine

## 2024-04-08 ENCOUNTER — Other Ambulatory Visit: Payer: Self-pay

## 2024-04-08 MED ORDER — APIXABAN 5 MG PO TABS: 5.0000 mg | ORAL_TABLET | Freq: Every day | ORAL | 3 refills | Status: AC

## 2024-06-15 ENCOUNTER — Ambulatory Visit: Payer: PRIVATE HEALTH INSURANCE | Admitting: Family Medicine

## 2024-08-24 ENCOUNTER — Encounter: Payer: PRIVATE HEALTH INSURANCE | Admitting: Family Medicine
